# Patient Record
Sex: Female | Born: 1960 | Race: White | Hispanic: No | Marital: Married | Smoking: Never smoker
Health system: Southern US, Community
[De-identification: ages and names within clinical notes are randomized; demographics above are authoritative.]

## PROBLEM LIST (undated history)

## (undated) DIAGNOSIS — E039 Hypothyroidism, unspecified: Secondary | ICD-10-CM

## (undated) HISTORY — PX: WISDOM TOOTH EXTRACTION: SHX21

## (undated) HISTORY — PX: CERVICAL POLYPECTOMY: SHX88

---

## 2005-09-25 ENCOUNTER — Encounter (INDEPENDENT_AMBULATORY_CARE_PROVIDER_SITE_OTHER): Payer: Self-pay | Admitting: *Deleted

## 2005-09-25 LAB — CONVERTED CEMR LAB

## 2005-10-01 ENCOUNTER — Encounter: Payer: Self-pay | Admitting: Family Medicine

## 2005-10-01 ENCOUNTER — Ambulatory Visit: Payer: Self-pay | Admitting: Family Medicine

## 2006-06-03 ENCOUNTER — Ambulatory Visit: Payer: Self-pay | Admitting: Family Medicine

## 2006-06-03 ENCOUNTER — Ambulatory Visit (HOSPITAL_COMMUNITY): Admission: RE | Admit: 2006-06-03 | Discharge: 2006-06-03 | Payer: Self-pay | Admitting: Family Medicine

## 2006-06-09 ENCOUNTER — Encounter: Admission: RE | Admit: 2006-06-09 | Discharge: 2006-06-09 | Payer: Self-pay | Admitting: Family Medicine

## 2006-06-11 ENCOUNTER — Ambulatory Visit: Payer: Self-pay | Admitting: Family Medicine

## 2006-06-22 ENCOUNTER — Other Ambulatory Visit: Admission: RE | Admit: 2006-06-22 | Discharge: 2006-06-22 | Payer: Self-pay | Admitting: General Surgery

## 2006-10-26 HISTORY — PX: THYROID LOBECTOMY: SHX420

## 2006-11-22 ENCOUNTER — Encounter: Admission: RE | Admit: 2006-11-22 | Discharge: 2006-11-22 | Payer: Self-pay | Admitting: Endocrinology

## 2006-11-22 ENCOUNTER — Encounter (INDEPENDENT_AMBULATORY_CARE_PROVIDER_SITE_OTHER): Payer: Self-pay | Admitting: Specialist

## 2006-11-22 ENCOUNTER — Other Ambulatory Visit: Admission: RE | Admit: 2006-11-22 | Discharge: 2006-11-22 | Payer: Self-pay | Admitting: Interventional Radiology

## 2006-12-24 ENCOUNTER — Encounter (INDEPENDENT_AMBULATORY_CARE_PROVIDER_SITE_OTHER): Payer: Self-pay | Admitting: *Deleted

## 2007-03-15 ENCOUNTER — Ambulatory Visit (HOSPITAL_COMMUNITY): Admission: RE | Admit: 2007-03-15 | Discharge: 2007-03-16 | Payer: Self-pay | Admitting: General Surgery

## 2007-03-15 ENCOUNTER — Encounter (INDEPENDENT_AMBULATORY_CARE_PROVIDER_SITE_OTHER): Payer: Self-pay | Admitting: General Surgery

## 2007-04-11 ENCOUNTER — Encounter: Payer: Self-pay | Admitting: Family Medicine

## 2007-11-17 ENCOUNTER — Other Ambulatory Visit: Admission: RE | Admit: 2007-11-17 | Discharge: 2007-11-17 | Payer: Self-pay | Admitting: Family Medicine

## 2007-11-29 ENCOUNTER — Encounter: Admission: RE | Admit: 2007-11-29 | Discharge: 2007-11-29 | Payer: Self-pay | Admitting: Endocrinology

## 2008-11-20 ENCOUNTER — Other Ambulatory Visit: Admission: RE | Admit: 2008-11-20 | Discharge: 2008-11-20 | Payer: Self-pay | Admitting: Family Medicine

## 2008-11-27 ENCOUNTER — Encounter: Admission: RE | Admit: 2008-11-27 | Discharge: 2008-11-27 | Payer: Self-pay | Admitting: Family Medicine

## 2008-12-13 ENCOUNTER — Encounter: Admission: RE | Admit: 2008-12-13 | Discharge: 2008-12-13 | Payer: Self-pay | Admitting: Internal Medicine

## 2009-12-09 IMAGING — MG MM SCREEN MAMMOGRAM BILATERAL
4 series · 4 of 4 positions shown · non-contrast
Comparison: none

DG SCREEN MAMMOGRAM BILATERAL
Bilateral CC and MLO view(s) were taken.

DIGITAL SCREENING MAMMOGRAM WITH CAD
The breast tissue is extremely dense.  No masses or malignant type calcifications are identified.

[R CC]
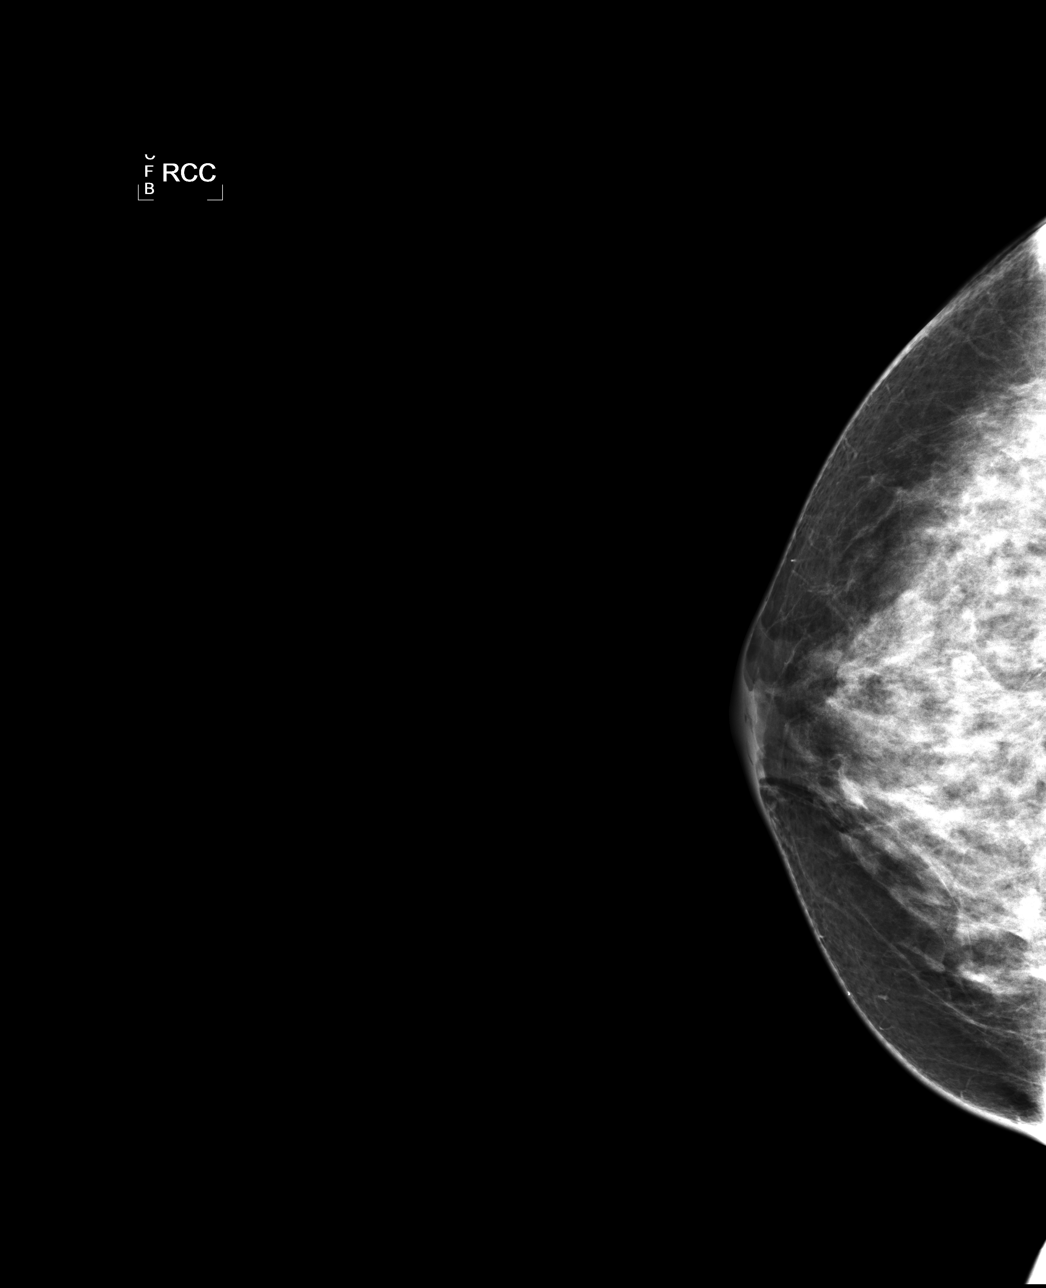

[L CC]
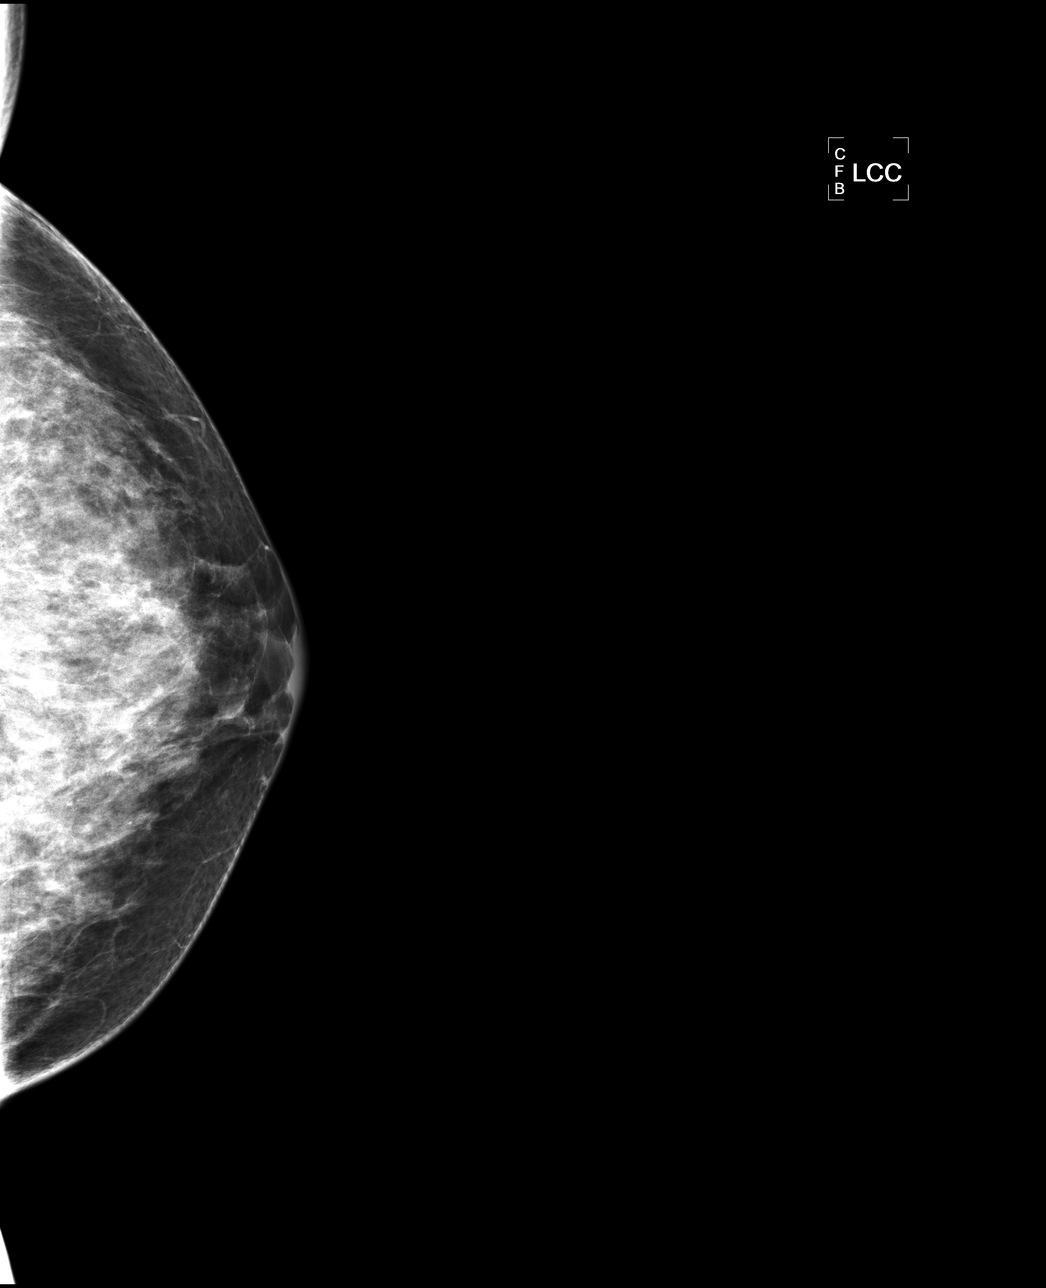

[L MLO]
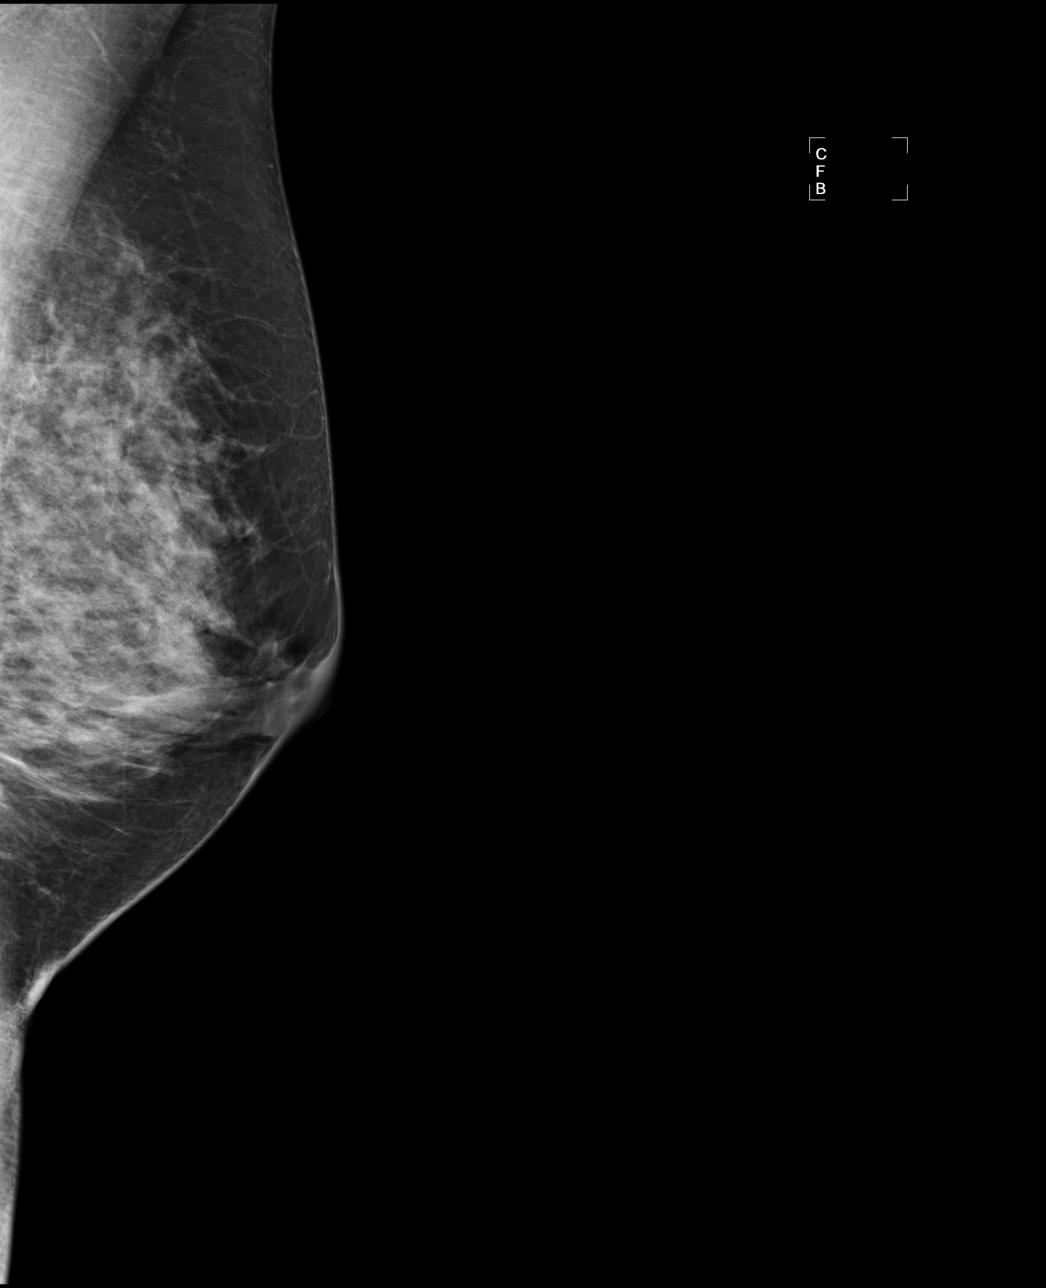

[R MLO]
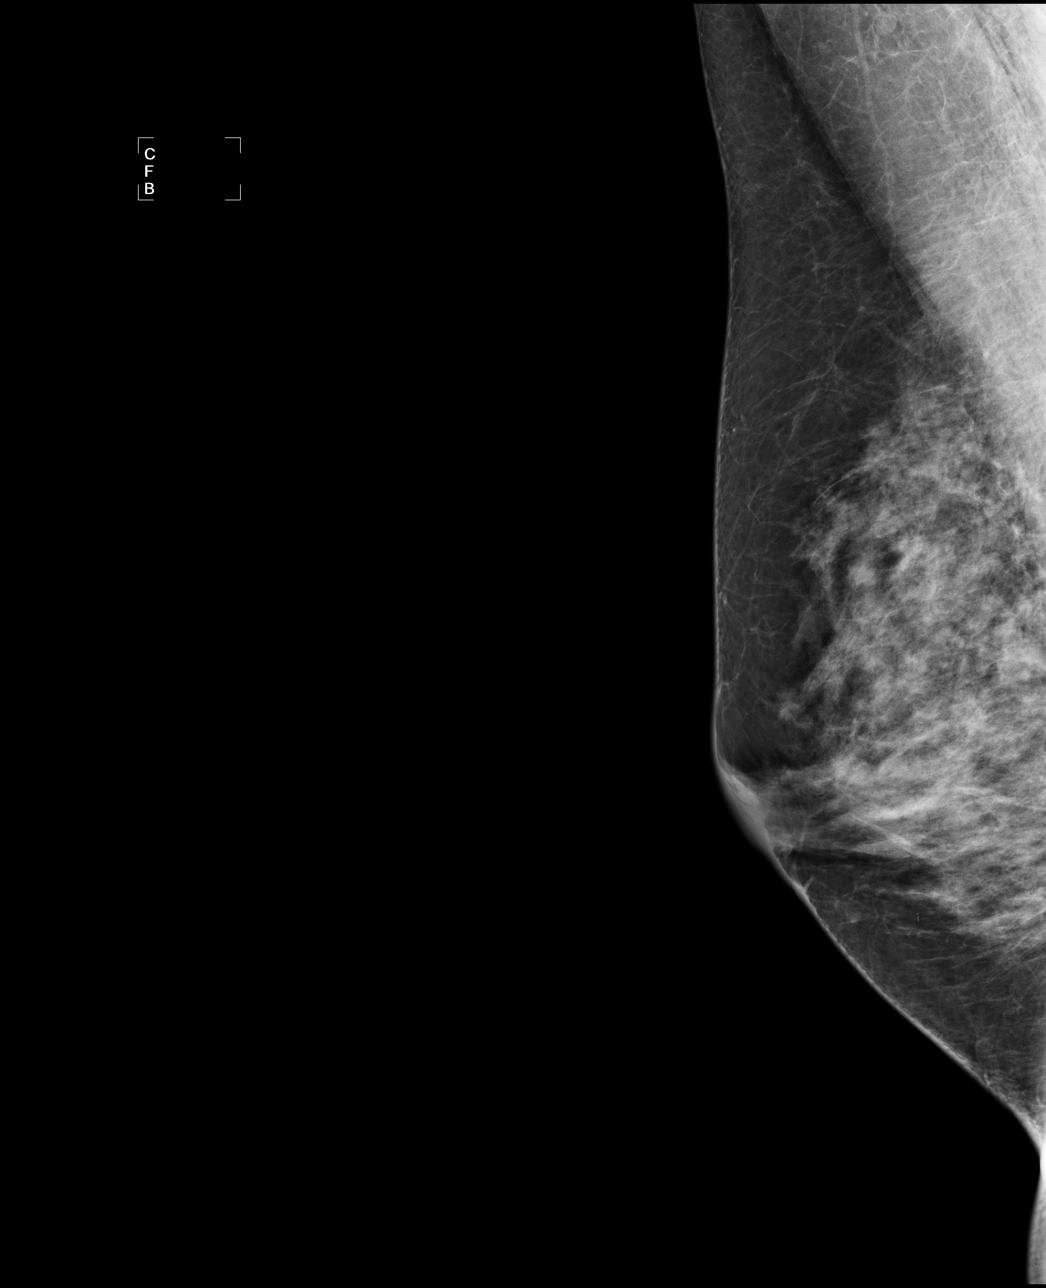

[4 of 4 positions shown; findings below may reference images not displayed]

IMPRESSION: No specific mammographic evidence of malignancy.  Next screening mammogram is recommended in one 
year.

ASSESSMENT: Negative - BI-RADS 1

Screening mammogram in 1 year.
ANALYZED BY COMPUTER AIDED DETECTION. , THIS PROCEDURE WAS A DIGITAL MAMMOGRAM.

## 2010-04-25 ENCOUNTER — Other Ambulatory Visit: Admission: RE | Admit: 2010-04-25 | Discharge: 2010-04-25 | Payer: Self-pay | Admitting: *Deleted

## 2010-05-09 ENCOUNTER — Encounter: Admission: RE | Admit: 2010-05-09 | Discharge: 2010-05-09 | Payer: Self-pay | Admitting: *Deleted

## 2011-03-10 NOTE — Op Note (Signed)
Helen Parker, Helen Parker              ACCOUNT NO.:  [id number]   MEDICAL RECORD NO.:  [id number]          PATIENT TYPE:  OIB   LOCATION:  2550                         FACILITY:  [facility]   PHYSICIAN:  Cherylynn Ridges, M.D.    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   POSTOPERATIVE DIAGNOSIS:  Benign thyroid mass, likely adenoma.   PROCEDURE:  Left total thyroid lobectomy with isthmusectomy.   SURGEON:  Dr. Lindie Spruce.   ASSISTANT:  Dr. Maple Hudson.   ANESTHESIA:  General endotracheal.   ESTIMATED BLOOD LOSS:  Less than 50 mL.   COMPLICATIONS:  None.   CONDITION:  Stable.   SPECIMEN:  Left thyroid lobe and isthmus.   FINDINGS:  About a 3-cm nodule in the left mid to lower lobe of the  thyroid gland on the left side.   INDICATIONS FOR OPERATION:  The patient is a 50 year old who had  equivocal needle biopsies of a large thyroid mass on the left lobe who  comes in now for thyroidectomy.   OPERATION:  The patient was taken to the operating room, placed on the  table in supine position.  After an adequate endotracheal anesthetic was  administered, a roll was placed underneath the shoulders, her neck was  hyperextended and she was prepped and draped in the usual sterile  manner.  We marked the neck for incision using an old silk suture.  We  used this line to make a 5-cm transverse incision.  The mid point was  marked.  We took it down to and through the platysmal muscle layer and  then we split it between the strap muscles.  Flaps were made superiorly  and inferiorly using electrocautery.  Bleeding was controlled with  electrocautery, although some anterior jugular connecting vessels were  ligated with 3-0 silk ties.   Once we got down below the deepest strap muscle layer, we used a Green  retractor to retract the strap muscles  retracting the thyroid gland  medially on the left side.  We initially approached the superior thyroid  vessels which were taken down using right-angle clamps, medium hemoclips  and also 3-0 silk ties.  We were eventually able to bring the upper lobe  downward and medially as we continued to dissect away the areolar tissue  lateral to the thyroid gland.  We stayed very close to the thyroid gland  itself dissecting away what appeared to be a superior parathyroid gland  and also an inferior parathyroid gland.  Staying close to the gland, we  able to ligate most of these vessels using 3-0 silk ties or small  hemoclips.  As we rolled the gland medially, we could see what appeared  to be the recurrent laryngeal nerve coming up from inferior to superior  up to the point where it entered into cricotracheal thyroid membrane.  The ligament attaching the thyroid gland to the trachea was taken down  using electrocautery as we stayed away from  the nerve itself.  We used  blunt and some sharp dissection as we rolled the thyroid medially out to  the isthmus where we came across with the electrocautery.  A piece of  Surgicel was placed in the wound as there was minimal bleeding and no  cautery or ligations were done around the nerve itself.  We irrigated  with some saline solution.  Once this was done, we closed the strap  muscles using interrupted 3-0 Vicryl sutures.  The platysmas muscle was  closed using interrupted 3-0 Vicryl sutures and the skin was closed  using interrupted simple stitches of 5-0 nylon.  Intervening Steri-  Strips were applied, 1/4 inch type. All counts were correct and a  sterile dressing was applied.  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      Cherylynn Ridges, M.D.  Electronically Signed     JOW/MEDQ  D:  03/15/2007  T:  03/15/2007  Job:  [id number]

## 2011-03-30 ENCOUNTER — Other Ambulatory Visit (HOSPITAL_COMMUNITY)
Admission: RE | Admit: 2011-03-30 | Discharge: 2011-03-30 | Disposition: A | Discharge status: Home / Self Care | Payer: BC Managed Care – PPO | Source: Ambulatory Visit | Attending: Internal Medicine | Admitting: Internal Medicine

## 2011-03-31 ENCOUNTER — Other Ambulatory Visit: Payer: Self-pay

## 2011-03-31 DIAGNOSIS — Z01419 Encounter for gynecological examination (general) (routine) without abnormal findings: Secondary | ICD-10-CM | POA: Insufficient documentation

## 2011-04-20 ENCOUNTER — Other Ambulatory Visit: Payer: Self-pay | Admitting: Family Medicine

## 2011-04-20 DIAGNOSIS — Z1231 Encounter for screening mammogram for malignant neoplasm of breast: Secondary | ICD-10-CM

## 2011-05-19 ENCOUNTER — Ambulatory Visit: Payer: Self-pay

## 2011-05-19 ENCOUNTER — Ambulatory Visit
Admission: RE | Admit: 2011-05-19 | Discharge: 2011-05-19 | Disposition: A | Discharge status: Home / Self Care | Payer: BC Managed Care – PPO | Source: Ambulatory Visit | Attending: Family Medicine | Admitting: Family Medicine

## 2011-05-19 DIAGNOSIS — Z1231 Encounter for screening mammogram for malignant neoplasm of breast: Secondary | ICD-10-CM

## 2011-07-13 ENCOUNTER — Other Ambulatory Visit: Payer: Self-pay | Admitting: Obstetrics and Gynecology

## 2011-09-30 ENCOUNTER — Encounter (HOSPITAL_BASED_OUTPATIENT_CLINIC_OR_DEPARTMENT_OTHER): Payer: Self-pay | Admitting: *Deleted

## 2011-09-30 ENCOUNTER — Other Ambulatory Visit: Payer: Self-pay | Admitting: Orthopedic Surgery

## 2011-09-30 NOTE — Progress Notes (Signed)
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

## 2011-10-01 ENCOUNTER — Encounter (HOSPITAL_BASED_OUTPATIENT_CLINIC_OR_DEPARTMENT_OTHER)
Admission: RE | Disposition: A | Discharge status: Home / Self Care | Payer: Self-pay | Source: Ambulatory Visit | Attending: Orthopedic Surgery

## 2011-10-01 ENCOUNTER — Encounter (HOSPITAL_BASED_OUTPATIENT_CLINIC_OR_DEPARTMENT_OTHER): Payer: Self-pay | Admitting: Anesthesiology

## 2011-10-01 ENCOUNTER — Ambulatory Visit (HOSPITAL_BASED_OUTPATIENT_CLINIC_OR_DEPARTMENT_OTHER): Payer: BC Managed Care – PPO | Admitting: Anesthesiology

## 2011-10-01 ENCOUNTER — Encounter (HOSPITAL_BASED_OUTPATIENT_CLINIC_OR_DEPARTMENT_OTHER): Payer: Self-pay

## 2011-10-01 ENCOUNTER — Ambulatory Visit (HOSPITAL_BASED_OUTPATIENT_CLINIC_OR_DEPARTMENT_OTHER)
Admission: RE | Admit: 2011-10-01 | Discharge: 2011-10-01 | Disposition: A | Discharge status: Home / Self Care | Payer: BC Managed Care – PPO | Source: Ambulatory Visit | Attending: Orthopedic Surgery | Admitting: Orthopedic Surgery

## 2011-10-01 ENCOUNTER — Encounter (HOSPITAL_BASED_OUTPATIENT_CLINIC_OR_DEPARTMENT_OTHER): Payer: Self-pay | Admitting: Orthopedic Surgery

## 2011-10-01 DIAGNOSIS — Y92009 Unspecified place in unspecified non-institutional (private) residence as the place of occurrence of the external cause: Secondary | ICD-10-CM | POA: Insufficient documentation

## 2011-10-01 DIAGNOSIS — IMO0002 Reserved for concepts with insufficient information to code with codable children: Secondary | ICD-10-CM | POA: Insufficient documentation

## 2011-10-01 DIAGNOSIS — X58XXXA Exposure to other specified factors, initial encounter: Secondary | ICD-10-CM | POA: Insufficient documentation

## 2011-10-01 HISTORY — PX: ORIF FINGER FRACTURE: SHX2122

## 2011-10-01 HISTORY — DX: Hypothyroidism, unspecified: E03.9

## 2011-10-01 SURGERY — OPEN REDUCTION INTERNAL FIXATION (ORIF) METACARPAL (FINGER) FRACTURE
Anesthesia: General | Site: Hand | Laterality: Left | Wound class: Clean

## 2011-10-01 MED ORDER — HYDROMORPHONE HCL PF 1 MG/ML IJ SOLN: 0.2500 mg | INTRAMUSCULAR | Status: DC | PRN

## 2011-10-01 MED ORDER — LACTATED RINGERS IV SOLN
INTRAVENOUS | Status: DC
  Administered 2011-10-01 (×3): via INTRAVENOUS

## 2011-10-01 MED ORDER — PROMETHAZINE HCL 25 MG/ML IJ SOLN: 6.2500 mg | INTRAMUSCULAR | Status: DC | PRN

## 2011-10-01 MED ORDER — HYDROCODONE-ACETAMINOPHEN 5-325 MG PO TABS: ORAL_TABLET | ORAL | Status: AC

## 2011-10-01 MED ORDER — LIDOCAINE HCL (PF) 2 % IJ SOLN
INTRAMUSCULAR | Status: DC | PRN
  Administered 2011-10-01: 4 mL

## 2011-10-01 MED ORDER — CEFAZOLIN SODIUM 1-5 GM-% IV SOLN
1.0000 g | Freq: Once | INTRAVENOUS | Status: AC
  Administered 2011-10-01: 1 g via INTRAVENOUS

## 2011-10-01 MED ORDER — CHLORHEXIDINE GLUCONATE 4 % EX LIQD: 60.0000 mL | Freq: Once | CUTANEOUS | Status: DC

## 2011-10-01 MED ORDER — MEPERIDINE HCL 25 MG/ML IJ SOLN: 6.2500 mg | INTRAMUSCULAR | Status: DC | PRN

## 2011-10-01 MED ORDER — MIDAZOLAM HCL 5 MG/5ML IJ SOLN
INTRAMUSCULAR | Status: DC | PRN
  Administered 2011-10-01: 2 mg via INTRAVENOUS

## 2011-10-01 MED ORDER — CEPHALEXIN 500 MG PO CAPS: 500.0000 mg | ORAL_CAPSULE | Freq: Three times a day (TID) | ORAL | Status: AC

## 2011-10-01 MED ORDER — PROPOFOL 10 MG/ML IV EMUL
INTRAVENOUS | Status: DC | PRN
  Administered 2011-10-01: 200 mg via INTRAVENOUS

## 2011-10-01 MED ORDER — DEXAMETHASONE SODIUM PHOSPHATE 10 MG/ML IJ SOLN
INTRAMUSCULAR | Status: DC | PRN
  Administered 2011-10-01: 10 mg via INTRAVENOUS

## 2011-10-01 MED ORDER — FENTANYL CITRATE 0.05 MG/ML IJ SOLN
INTRAMUSCULAR | Status: DC | PRN
  Administered 2011-10-01 (×2): 25 ug via INTRAVENOUS
  Administered 2011-10-01: 100 ug via INTRAVENOUS

## 2011-10-01 MED ORDER — ONDANSETRON HCL 4 MG/2ML IJ SOLN
INTRAMUSCULAR | Status: DC | PRN
  Administered 2011-10-01: 4 mg via INTRAVENOUS

## 2011-10-01 SURGICAL SUPPLY — 67 items
BANDAGE ADHESIVE 1X3 (GAUZE/BANDAGES/DRESSINGS) IMPLANT
BANDAGE ELASTIC 3 VELCRO ST LF (GAUZE/BANDAGES/DRESSINGS) ×2 IMPLANT
BANDAGE GAUZE ELAST BULKY 4 IN (GAUZE/BANDAGES/DRESSINGS) IMPLANT
BIT DRILL 1.0 (BIT) ×2
BIT DRILL 1.0X50 (BIT) IMPLANT
BLADE MINI RND TIP GREEN BEAV (BLADE) ×1 IMPLANT
BLADE SURG 15 STRL LF DISP TIS (BLADE) ×1 IMPLANT
BLADE SURG 15 STRL SS (BLADE) ×2
BNDG CMPR 9X4 STRL LF SNTH (GAUZE/BANDAGES/DRESSINGS) ×1
BNDG CMPR MD 5X2 ELC HKLP STRL (GAUZE/BANDAGES/DRESSINGS) ×1
BNDG COHESIVE 1X5 TAN STRL LF (GAUZE/BANDAGES/DRESSINGS) IMPLANT
BNDG ELASTIC 2 VLCR STRL LF (GAUZE/BANDAGES/DRESSINGS) ×1 IMPLANT
BNDG ESMARK 4X9 LF (GAUZE/BANDAGES/DRESSINGS) ×1 IMPLANT
BRUSH SCRUB EZ PLAIN DRY (MISCELLANEOUS) ×2 IMPLANT
CANISTER SUCTION 1200CC (MISCELLANEOUS) ×1 IMPLANT
CLOTH BEACON ORANGE TIMEOUT ST (SAFETY) ×1 IMPLANT
CORDS BIPOLAR (ELECTRODE) ×2 IMPLANT
COVER MAYO STAND STRL (DRAPES) ×2 IMPLANT
COVER TABLE BACK 60X90 (DRAPES) ×2 IMPLANT
CUFF TOURNIQUET SINGLE 18IN (TOURNIQUET CUFF) ×1 IMPLANT
DECANTER SPIKE VIAL GLASS SM (MISCELLANEOUS) ×1 IMPLANT
DRAPE OEC MINIVIEW 54X84 (DRAPES) ×3 IMPLANT
DRAPE SURG 17X23 STRL (DRAPES) ×2 IMPLANT
GLOVE BIO SURGEON STRL SZ 6.5 (GLOVE) ×1 IMPLANT
GLOVE BIOGEL M STRL SZ7.5 (GLOVE) ×2 IMPLANT
GLOVE EXAM NITRILE EXT CUFF MD (GLOVE) ×1 IMPLANT
GLOVE ORTHO TXT STRL SZ7.5 (GLOVE) ×2 IMPLANT
GOWN PREVENTION PLUS XLARGE (GOWN DISPOSABLE) ×2 IMPLANT
GOWN STRL REIN XL XLG (GOWN DISPOSABLE) ×4 IMPLANT
NEEDLE 27GAX1X1/2 (NEEDLE) ×1 IMPLANT
NS IRRIG 1000ML POUR BTL (IV SOLUTION) ×2 IMPLANT
PACK BASIN DAY SURGERY FS (CUSTOM PROCEDURE TRAY) ×2 IMPLANT
PAD CAST 3X4 CTTN HI CHSV (CAST SUPPLIES) ×1 IMPLANT
PAD CAST 4YDX4 CTTN HI CHSV (CAST SUPPLIES) IMPLANT
PADDING CAST ABS 4INX4YD NS (CAST SUPPLIES) ×1
PADDING CAST ABS COTTON 4X4 ST (CAST SUPPLIES) ×1 IMPLANT
PADDING CAST COTTON 3X4 STRL (CAST SUPPLIES) ×2
PADDING CAST COTTON 4X4 STRL (CAST SUPPLIES)
PADDING UNDERCAST 2  STERILE (CAST SUPPLIES) ×1 IMPLANT
PLATE Y 1.3 3H HEAD/8H SFT (Plate) IMPLANT
PLATE-Y 1.3 3H HEAD/8H SFT (Plate) ×2 IMPLANT
SCREW SELF TAP CORTEX 1.3 10MM (Screw) ×2 IMPLANT
SCREW SELF TAP CORTEX 1.3 12MM (Screw) ×1 IMPLANT
SCREW SELF TAP CORTEX 1.3 13MM (Screw) ×1 IMPLANT
SCREW SELF TAP CORTEX 1.3 6MM (Screw) ×3 IMPLANT
SCREW SELF TAP CORTEX 1.3 8MM (Screw) ×1 IMPLANT
SLEEVE SCD COMPRESS KNEE MED (MISCELLANEOUS) ×1 IMPLANT
SPLINT PLASTER CAST XFAST 3X15 (CAST SUPPLIES) ×1 IMPLANT
SPLINT PLASTER XTRA FASTSET 3X (CAST SUPPLIES) ×14
SPONGE GAUZE 4X4 12PLY (GAUZE/BANDAGES/DRESSINGS) ×2 IMPLANT
STOCKINETTE 4X48 STRL (DRAPES) ×2 IMPLANT
SUCTION FRAZIER TIP 10 FR DISP (SUCTIONS) ×1 IMPLANT
SUT ETHILON 4 0 PS 2 18 (SUTURE) ×1 IMPLANT
SUT MERSILENE 4 0 P 3 (SUTURE) ×1 IMPLANT
SUT PROLENE 3 0 PS 2 (SUTURE) ×2 IMPLANT
SUT PROLENE 4 0 P 3 18 (SUTURE) ×1 IMPLANT
SUT VIC AB 4-0 P-3 18XBRD (SUTURE) ×1 IMPLANT
SUT VIC AB 4-0 P3 18 (SUTURE) ×2
SYR 3ML 23GX1 SAFETY (SYRINGE) IMPLANT
SYR BULB 3OZ (MISCELLANEOUS) ×2 IMPLANT
SYR CONTROL 10ML LL (SYRINGE) IMPLANT
TAPE STRIPS DRAPE STRL (GAUZE/BANDAGES/DRESSINGS) ×1 IMPLANT
TOWEL OR 17X24 6PK STRL BLUE (TOWEL DISPOSABLE) ×2 IMPLANT
TRAY DSU PREP LF (CUSTOM PROCEDURE TRAY) ×2 IMPLANT
TUBE CONNECTING 20X1/4 (TUBING) ×1 IMPLANT
UNDERPAD 30X30 INCONTINENT (UNDERPADS AND DIAPERS) ×2 IMPLANT
WATER STERILE IRR 1000ML POUR (IV SOLUTION) IMPLANT

## 2011-10-01 NOTE — Brief Op Note (Signed)
10/01/2011  3:37 PM  PATIENT:  Helen Parker  50 y.o. female  PRE-OPERATIVE DIAGNOSIS:  left small proximal phalanx fracture  POST-OPERATIVE DIAGNOSIS:  same  PROCEDURE:  Procedure(s): OPEN REDUCTION INTERNAL FIXATION (ORIF)  LEFT SMALL FINGER COMMINUTED ARTICULAR PILON FRACTURE WITH 1.3 MM AO PLATE  SURGEON:  Surgeon(s): Wyn Forster., MD  PHYSICIAN ASSISTANT:   ASSISTANTS: Annye Rusk, P.A-C  ANESTHESIA:   general  EBL:  Total I/O In: 2000 [I.V.:2000] Out: -   BLOOD ADMINISTERED:none  DRAINS: none   LOCAL MEDICATIONS USED:  LIDOCAINE 4 CC  SPECIMEN:  No Specimen  DISPOSITION OF SPECIMEN:  N/A  COUNTS:  YES  TOURNIQUET:  * Missing tourniquet times found for documented tourniquets in log:  12767 *  DICTATION: .Other Dictation: Dictation Number [id number]  PLAN OF CARE: Discharge to home after PACU  PATIENT DISPOSITION:  PACU - hemodynamically stable.

## 2011-10-01 NOTE — Op Note (Signed)
op note dictated  [phone] 10/01/11

## 2011-10-01 NOTE — H&P (Signed)
Helen Parker is an 50 y.o. female.   Chief Complaint: Complaining of fracture to the left small finger. HPI: Patient is a 50 year old right-hand-dominant female who sustained an injury to her right small finger while attempting to catch a football. She was visiting [facility] during the time of the injury. She went to one of the local urgent cares. X-rays were taken this revealed a comminuted fracture of the P-one segment of the left small finger. She was placed in a splint and told to followup with an orthopedic surgeon when she returned to [facility]. She presented to our office on Wednesday, 09/30/2011 for further evaluation and treatment. We reviewed her x-rays and examined the finger. She was placed in a new splint and instructed that she would be best served by undergoing open reduction internal fixation of the fracture.  Past Medical History  Diagnosis Date  . Hypothyroidism     Past Surgical History  Procedure Date  . Wisdom tooth extraction   . Thyroid lobectomy 2008    lt     History reviewed. No pertinent family history. Social History:  reports that she has never smoked. She does not have any smokeless tobacco history on file. She reports that she drinks alcohol. She reports that she does not use illicit drugs.  Allergies: No Known Allergies  Medications Prior to Admission  Medication Dose Route Frequency Provider Last Rate Last Dose  . ceFAZolin (ANCEF) IVPB 1 g/50 mL premix  1 g Intravenous Once       . chlorhexidine (HIBICLENS) 4 % liquid 4 application  60 mL Topical Once       . lactated ringers infusion   Intravenous Continuous Josepha Pigg, MD 20 mL/hr at 10/01/11 1256     Medications Prior to Admission  Medication Sig Dispense Refill  . acetaminophen (TYLENOL) 325 MG tablet Take 650 mg by mouth every 6 (six) hours as needed.        . calcium-vitamin D (OSCAL WITH D) 500-200 MG-UNIT per tablet Take 1 tablet by mouth daily.        . ferrous fumarate  (HEMOCYTE - 106 MG FE) 325 (106 FE) MG TABS Take 1 tablet by mouth.        . levothyroxine (SYNTHROID, LEVOTHROID) 50 MCG tablet Take 50 mcg by mouth daily.          No results found for this or any previous visit (from the past 48 hour(s)).  No results found.   Pertinent items are noted in HPI.  Blood pressure 144/71, pulse 72, temperature 98 F (36.7 C), temperature source Oral, resp. rate 20, height 5\' 8"  (1.727 m), weight 70.308 kg (155 lb), SpO2 100.00%.  General appearance: alert Head: Normocephalic, without obvious abnormality Neck: supple, symmetrical, trachea midline Resp: clear to auscultation bilaterally Cardio: regular rate and rhythm, S1, S2 normal, no murmur, click, rub or gallop GI: normal findings: bowel sounds normal Extremities: Left small finger reveals obvious swelling and deformity with diffuse ecchymosis. She is intact from a neurovascular status distally. X-rays reveal a comminuted fracture of the proximal phalanx. Pulses: 2+ and symmetric Skin: normal Neurologic: Grossly normal    Assessment/Plan Impression: Comminuted fracture of the proximal phalanx of the left small finger.  Plan: Open reduction internal fixation of the left small finger proximal phalanx  DASNOIT,Carlye Panameno J 10/01/2011, 1:37 PM    H&P documentation: 10/01/2011  -History and Physical Reviewed  -Patient has been re-examined  -No change in the plan of care  Molly Maduro  Glenice Bow, MD

## 2011-10-01 NOTE — Transfer of Care (Signed)
Immediate Anesthesia Transfer of Care Note  Patient: Helen Parker  Procedure(s) Performed:  OPEN REDUCTION INTERNAL FIXATION (ORIF) METACARPAL (FINGER) FRACTURE - left small proximal phalanx  Patient Location: PACU  Anesthesia Type: General  Level of Consciousness: sedated  Airway & Oxygen Therapy: Patient Spontanous Breathing and Patient connected to face mask oxygen  Post-op Assessment: Report given to PACU RN and Post -op Vital signs reviewed and stable  Post vital signs: Reviewed and stable  Complications: No apparent anesthesia complications

## 2011-10-01 NOTE — Anesthesia Procedure Notes (Signed)
Procedure Name: LMA Insertion Date/Time: 10/01/2011 2:18 PM Performed by: Gladys Damme Pre-anesthesia Checklist: Patient identified, Timeout performed, Emergency Drugs available, Suction available and Patient being monitored Patient Re-evaluated:Patient Re-evaluated prior to inductionOxygen Delivery Method: Circle System Utilized Preoxygenation: Pre-oxygenation with 100% oxygen Ventilation: Mask ventilation without difficulty LMA: LMA inserted LMA Size: 4.0 Number of attempts: 1 Placement Confirmation: breath sounds checked- equal and bilateral and positive ETCO2 Tube secured with: Tape Dental Injury: Teeth and Oropharynx as per pre-operative assessment

## 2011-10-01 NOTE — Anesthesia Preprocedure Evaluation (Signed)
Anesthesia Evaluation  Patient identified by MRN, date of birth, ID band Patient awake    Reviewed: Allergy & Precautions, H&P , NPO status , Patient's Chart, lab work & pertinent test results  Airway Mallampati: II TM Distance: >3 FB Neck ROM: full    Dental No notable dental hx. (+) Teeth Intact   Pulmonary neg pulmonary ROS,  clear to auscultation  Pulmonary exam normal       Cardiovascular neg cardio ROS regular Normal    Neuro/Psych Negative Neurological ROS  Negative Psych ROS   GI/Hepatic negative GI ROS, Neg liver ROS,   Endo/Other  Negative Endocrine ROS  Renal/GU negative Renal ROS  Genitourinary negative   Musculoskeletal   Abdominal Normal abdominal exam  (+)   Peds  Hematology negative hematology ROS (+)   Anesthesia Other Findings   Reproductive/Obstetrics negative OB ROS                           Anesthesia Physical Anesthesia Plan  ASA: II  Anesthesia Plan: General   Post-op Pain Management:    Induction: Intravenous  Airway Management Planned: LMA  Additional Equipment:   Intra-op Plan:   Post-operative Plan: Extubation in OR  Informed Consent: I have reviewed the patients History and Physical, chart, labs and discussed the procedure including the risks, benefits and alternatives for the proposed anesthesia with the patient or authorized representative who has indicated his/her understanding and acceptance.     Plan Discussed with: CRNA  Anesthesia Plan Comments:         Anesthesia Quick Evaluation

## 2011-10-01 NOTE — Anesthesia Postprocedure Evaluation (Signed)
  Anesthesia Post-op Note  Patient: Helen Parker  Procedure(s) Performed:  OPEN REDUCTION INTERNAL FIXATION (ORIF) METACARPAL (FINGER) FRACTURE - left small proximal phalanx  Patient Location: PACU  Anesthesia Type: General  Level of Consciousness: awake  Airway and Oxygen Therapy: Patient Spontanous Breathing and Patient connected to face mask oxygen  Post-op Pain: mild  Post-op Assessment: Post-op Vital signs reviewed, Patient's Cardiovascular Status Stable, Respiratory Function Stable and Patent Airway  Post-op Vital Signs: Reviewed and stable  Complications: No apparent anesthesia complications

## 2011-10-02 NOTE — Op Note (Signed)
Helen Parker, Helen Parker              ACCOUNT NO.:  [id number]  MEDICAL RECORD NO.:  [id number]  LOCATION:                                 FACILITY:  PHYSICIAN:  Helen Fitch. Ansh Fauble, M.D. DATE OF BIRTH:  08-18-1961  DATE OF PROCEDURE:  10/01/2011 DATE OF DISCHARGE:                              OPERATIVE REPORT   PREOPERATIVE DIAGNOSIS:  Comminuted intra-articular pilon fracture of left small finger proximal phalangeal, proximal metaphysis and epiphysis at MP joint.  POSTOPERATIVE DIAGNOSIS:  Comminuted intra-articular pilon fracture of left small finger proximal phalangeal, proximal metaphysis and epiphysis at MP joint.  OPERATIONS:  Open reduction, internal fixation with an AO 1.3 mm stainless steel bridge plate and lag screw fixation.  SURGEON: Helen Fitch. Helen Grosso, MD.  ASSISTANT:  Helen Rusk, PA-C  ANESTHESIA:  General by LMA.  SUPERVISING ANESTHESIOLOGIST:  Dr. Sampson Parker.  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 While throwing the football with her husband, on September 28, 2011, she accidentally missed caught the ball with a direct blow to the tip of her left small finger.  She had immediate pain, swelling, and ecchymosis. Plain films obtained of her finger demonstrated a comminuted articular  pilon fracture of the left small finger proximal phalangeal base that is shortened more than 4 mm.  She had a claw posture of the finger.  She was splinted and referred for an upper extremity consult.  Past history is reviewed in detail.  She is basically healthy.  She had a history of hypothyroidism and is on replacement thyroid medication.  We advised her that this fracture was very complex.  It needs to be reduced anatomically and secured with interfragmentary lag fixation to control the articular fragments and a bridge plate to regain length.  Our goal is to obtain an anatomic construct that will allow early  active range of motion.  The nature of this fracture is that it is complex and frequently leads to marked finger stiffness and often malunion.  Our first choice is to perform plate fixation followed by early motion.  Questions regarding the anticipated treatment were invited and answered in detail.  PROCEDURE:  Helen Parker is brought to room 1 of the [facility] and placed supine position on the operating table.  Following detailed anesthesia, informed consent by Dr. Sampson Parker general anesthesia by LMA technique was recommended and accepted.  Under Dr. Jarrett Parker direct supervision, general anesthesia by LMA technique was induced followed by routine Betadine scrub and paint of the left upper extremity.  A pneumatic tourniquet was applied proximal brachium, 1 g of Ancef was administered as an IV prophylactic antibiotic.  Procedure commenced the routine surgical time-out.  The arm was exsanguinated with Esmarch bandage and the arterial tourniquet inflated to 220 mmHg.  The proximal phalanx and MP joint capsule were exposed through a longitudinal incision that curved ulnarly around the metacarpal head. The extensor tendon split in the midline revealing the capsule of the MP joint.  The capsule was not taken down nor with the soft tissues at the base of the proximal phalanx as they will be used to hinge the reduction of the fracture.  The fracture site was entered and a dental pick was used to rearrange fragments to allow anatomic reduction of the main shaft fragments.  The articular fracture fragments work then controlled by an extra periosteal dissection with use of a small bone clamp to reduce them anatomically.  A 5 x3 hole construct was created with an AO 1.3-mm plate that was trimmed to the appropriate length followed by placement of carefully measured lag screws in the articular fragments and 4 shaft screws with direct measurement.  C-arm fluoroscopic control  was used to control the reduction of the fracture and the length of the screws.  After some adjustments, an anatomic reduction was achieved.  The wound was then irrigated with sterile saline followed by meticulous repair of the periosteum over the plate with figure-of-eight sutures of 4-0 Vicryl followed by repair of the extensor mechanism with figure-of-eight sutures of 4-0 Mersilene. The skin was repaired with intradermal 3-0 Prolene with Steri-Strips.  Helen Parker was placed on forearm-based ring and small finger safe position splint.  There are no apparent complications.  For aftercare, Helen Parker will be advised to elevate her hand for approximately 48 hours.  She is provided prescriptions for hydrocodone 1 or 2 tablets p.o. q.4-6 h. p.r.n. pain, 30 tablets without refill. Also, Keflex 500 mg 1 p.o. q.8 h.  x3 days.  She is encouraged to use Aleve or Advil for mild-to-moderate pain.     Helen Parker, M.D.     RVS/MEDQ  D:  10/01/2011  T:  10/02/2011  Job:  [id number]  cc:   Helen Fiddler, MD

## 2011-10-05 ENCOUNTER — Encounter (HOSPITAL_BASED_OUTPATIENT_CLINIC_OR_DEPARTMENT_OTHER): Payer: Self-pay | Admitting: Orthopedic Surgery

## 2011-10-05 NOTE — Addendum Note (Signed)
Addendum  created 10/05/11 1104 by Jewel Baize Tommye Lehenbauer   Modules edited:Anesthesia Responsible Staff

## 2011-10-05 NOTE — Addendum Note (Signed)
Addendum  created 10/05/11 0941 by Lance Coon, CRNA   Modules edited:Anesthesia Events

## 2011-10-29 ENCOUNTER — Other Ambulatory Visit: Payer: Self-pay | Admitting: Dermatology

## 2011-11-04 ENCOUNTER — Encounter (HOSPITAL_BASED_OUTPATIENT_CLINIC_OR_DEPARTMENT_OTHER): Payer: Self-pay

## 2012-06-02 ENCOUNTER — Other Ambulatory Visit: Payer: Self-pay | Admitting: Family Medicine

## 2012-06-02 DIAGNOSIS — Z1231 Encounter for screening mammogram for malignant neoplasm of breast: Secondary | ICD-10-CM

## 2012-06-23 ENCOUNTER — Ambulatory Visit
Admission: RE | Admit: 2012-06-23 | Discharge: 2012-06-23 | Disposition: A | Discharge status: Home / Self Care | Payer: BC Managed Care – PPO | Source: Ambulatory Visit | Attending: Family Medicine | Admitting: Family Medicine

## 2012-06-23 DIAGNOSIS — Z1231 Encounter for screening mammogram for malignant neoplasm of breast: Secondary | ICD-10-CM

## 2012-09-07 ENCOUNTER — Other Ambulatory Visit: Payer: Self-pay | Admitting: Obstetrics and Gynecology

## 2012-09-07 ENCOUNTER — Other Ambulatory Visit (HOSPITAL_COMMUNITY)
Admission: RE | Admit: 2012-09-07 | Discharge: 2012-09-07 | Disposition: A | Discharge status: Home / Self Care | Payer: BC Managed Care – PPO | Source: Ambulatory Visit | Attending: Obstetrics and Gynecology | Admitting: Obstetrics and Gynecology

## 2012-09-07 DIAGNOSIS — Z01419 Encounter for gynecological examination (general) (routine) without abnormal findings: Secondary | ICD-10-CM | POA: Insufficient documentation

## 2013-03-29 ENCOUNTER — Telehealth: Payer: Self-pay

## 2013-03-29 NOTE — Telephone Encounter (Signed)
LMOM to call.

## 2013-04-12 NOTE — Telephone Encounter (Signed)
LMOM to call.

## 2013-04-18 NOTE — Telephone Encounter (Signed)
Letter to pt and PCP.  

## 2013-09-19 ENCOUNTER — Other Ambulatory Visit: Payer: Self-pay | Admitting: Obstetrics and Gynecology

## 2013-09-19 ENCOUNTER — Other Ambulatory Visit (HOSPITAL_COMMUNITY)
Admission: RE | Admit: 2013-09-19 | Discharge: 2013-09-19 | Disposition: A | Discharge status: Home / Self Care | Payer: BC Managed Care – PPO | Source: Ambulatory Visit | Attending: Obstetrics and Gynecology | Admitting: Obstetrics and Gynecology

## 2013-09-19 DIAGNOSIS — Z01419 Encounter for gynecological examination (general) (routine) without abnormal findings: Secondary | ICD-10-CM | POA: Insufficient documentation

## 2013-09-19 DIAGNOSIS — Z1151 Encounter for screening for human papillomavirus (HPV): Secondary | ICD-10-CM | POA: Insufficient documentation

## 2014-04-23 ENCOUNTER — Other Ambulatory Visit: Payer: Self-pay

## 2014-04-23 DIAGNOSIS — Z1231 Encounter for screening mammogram for malignant neoplasm of breast: Secondary | ICD-10-CM

## 2014-05-10 ENCOUNTER — Ambulatory Visit
Admission: RE | Admit: 2014-05-10 | Discharge: 2014-05-10 | Disposition: A | Discharge status: Home / Self Care | Payer: BC Managed Care – PPO | Source: Ambulatory Visit

## 2014-05-10 ENCOUNTER — Encounter (INDEPENDENT_AMBULATORY_CARE_PROVIDER_SITE_OTHER): Payer: Self-pay

## 2014-05-10 DIAGNOSIS — Z1231 Encounter for screening mammogram for malignant neoplasm of breast: Secondary | ICD-10-CM

## 2014-09-03 ENCOUNTER — Encounter: Payer: Self-pay | Admitting: Gastroenterology

## 2014-09-24 ENCOUNTER — Other Ambulatory Visit: Payer: Self-pay | Admitting: Obstetrics and Gynecology

## 2014-09-24 ENCOUNTER — Other Ambulatory Visit (HOSPITAL_COMMUNITY)
Admission: RE | Admit: 2014-09-24 | Discharge: 2014-09-24 | Disposition: A | Discharge status: Home / Self Care | Payer: BC Managed Care – PPO | Source: Ambulatory Visit | Attending: Obstetrics and Gynecology | Admitting: Obstetrics and Gynecology

## 2014-09-24 DIAGNOSIS — Z01419 Encounter for gynecological examination (general) (routine) without abnormal findings: Secondary | ICD-10-CM | POA: Insufficient documentation

## 2014-09-25 LAB — CYTOLOGY - PAP

## 2014-11-19 ENCOUNTER — Encounter: Payer: BC Managed Care – PPO | Admitting: Gastroenterology

## 2015-02-15 ENCOUNTER — Other Ambulatory Visit: Payer: Self-pay

## 2015-02-15 DIAGNOSIS — Z1231 Encounter for screening mammogram for malignant neoplasm of breast: Secondary | ICD-10-CM

## 2015-05-14 ENCOUNTER — Ambulatory Visit: Payer: Self-pay

## 2015-05-20 ENCOUNTER — Ambulatory Visit
Admission: RE | Admit: 2015-05-20 | Discharge: 2015-05-20 | Disposition: A | Discharge status: Home / Self Care | Payer: BLUE CROSS/BLUE SHIELD | Source: Ambulatory Visit

## 2015-05-20 DIAGNOSIS — Z1231 Encounter for screening mammogram for malignant neoplasm of breast: Secondary | ICD-10-CM

## 2015-09-23 ENCOUNTER — Other Ambulatory Visit: Payer: Self-pay | Admitting: Obstetrics and Gynecology

## 2015-09-23 ENCOUNTER — Other Ambulatory Visit (HOSPITAL_COMMUNITY)
Admission: RE | Admit: 2015-09-23 | Discharge: 2015-09-23 | Disposition: A | Discharge status: Home / Self Care | Payer: BLUE CROSS/BLUE SHIELD | Source: Ambulatory Visit | Attending: Obstetrics and Gynecology | Admitting: Obstetrics and Gynecology

## 2015-09-23 DIAGNOSIS — Z01419 Encounter for gynecological examination (general) (routine) without abnormal findings: Secondary | ICD-10-CM | POA: Diagnosis not present

## 2015-09-24 LAB — CYTOLOGY - PAP

## 2016-03-02 ENCOUNTER — Telehealth: Payer: Self-pay

## 2016-03-02 NOTE — Telephone Encounter (Signed)
Per Helen Parker, pt would like to have a screening colonoscopy. I called pt and she will call me back a little later today.

## 2016-03-03 NOTE — Telephone Encounter (Signed)
Triaged pt and scheduled her for 03/30/2016 at 9:30 Am with Dr. Darrick PennaFields.

## 2016-03-09 NOTE — Telephone Encounter (Signed)
Gastroenterology Pre-Procedure Review  Request Date: 03/02/2016 Requesting Physician: PT REQUESTING HER FIRST COLONOSCOPY ( THIS IS LESLIE'S S[employer]IN[employer])  PATIENT REVIEW QUESTIONS: The patient responded to the following health history questions as indicated:    1. Diabetes Melitis: no 2. Joint replacements in the past 12 months: no 3. Major health problems in the past 3 months: no 4. Has an artificial valve or MVP: no 5. Has a defibrillator: no 6. Has been advised in past to take antibiotics in advance of a procedure like teeth cleaning: no 7. Family history of colon cancer: no  8. Alcohol Use: OCCASIONALLY 9. History of sleep apnea: no     MEDICATIONS & ALLERGIES:    Patient reports the following regarding taking any blood thinners:   Plavix? no Aspirin? no Coumadin? no  Patient confirms/reports the following medications:  Current Outpatient Prescriptions  Medication Sig Dispense Refill  . acetaminophen (TYLENOL) 325 MG tablet Take 650 mg by mouth every 6 (six) hours as needed.      . calcium-vitamin D (OSCAL WITH D) 500-200 MG-UNIT per tablet Take 1 tablet by mouth daily.      . ferrous fumarate (HEMOCYTE - 106 MG FE) 325 (106 FE) MG TABS Take 1 tablet by mouth.      . levothyroxine (SYNTHROID, LEVOTHROID) 50 MCG tablet Take 50 mcg by mouth daily.       No current facility-administered medications for this visit.    Patient confirms/reports the following allergies:  No Known Allergies  No orders of the defined types were placed in this encounter.    AUTHORIZATION INFORMATION Primary Insurance:   ID #:   Group #:  Pre-Cert / Auth required: Pre-Cert / Auth #:   Secondary Insurance:   ID #:   Group #:  Pre-Cert / Auth required:  Pre-Cert / Auth #:   SCHEDULE INFORMATION: Procedure has been scheduled as follows:  Date:  03/30/2016              Time: 9:30 AM  Location: [facility] Short Stay  This Gastroenterology Pre-Precedure Review Form is being routed to  the following provider(s): Jonette EvaSandi Fields, MD

## 2016-03-10 NOTE — Telephone Encounter (Signed)
PREPOPIK-DRINK WATER TO KEEP URINE LIGHT YELLOW.  FULL LIQUIDS WITH BREAKFAST.  Full Liquid Diet A high-calorie, high-protein supplement should be used to meet your nutritional requirements when the full liquid diet is continued for more than 2 or 3 days. If this diet is to be used for an extended period of time (more than 7 days), a multivitamin should be considered.  Breads and Starches  Allowed: None are allowed   Avoid: Any others.    Potatoes/Pasta/Rice  Allowed: ANY ITEM AS A SOUP OR SMALL PLATE OF MASHED POTATOES OR SCRAMBLED EGGS.       Vegetables  Allowed: Strained tomato or vegetable juice. Vegetables pureed in soup.   Avoid: Any others.    Fruit  Allowed: Any strained fruit juices and fruit drinks. Include 1 serving of citrus or vitamin C-enriched fruit juice daily.   Avoid: Any others.  Meat and Meat Substitutes  Allowed: Egg  Avoid: Any meat, fish, or fowl. All cheese.  Milk  Allowed: SOY Milk beverages, including milk shakes and instant breakfast mixes. Smooth yogurt.   Avoid: Any others. Avoid dairy products if not tolerated.    Soups and Combination Foods  Allowed: Broth, strained cream soups. Strained, broth-based soups.   Avoid: Any others.    Desserts and Sweets  Allowed: flavored gelatin, tapioca, ice cream, sherbet, smooth pudding, junket, fruit ices, frozen ice pops, pudding pops, frozen fudge pops, chocolate syrup. Sugar, honey, jelly, syrup.   Avoid: Any others.  Fats and Oils  Allowed: Margarine, butter, cream, sour cream, oils.   Avoid: Any others.  Beverages  Allowed: All.   Avoid: None.  Condiments  Allowed: Iodized salt, pepper, spices, flavorings. Cocoa powder.   Avoid: Any others.    SAMPLE MEAL PLAN Breakfast   cup orange juice.   1 OR 2 EGGS  1 cup milk.   1 cup beverage (coffee or tea).   Cream or sugar, if desired.    Midmorning Snack  2 SCRAMBLED OR HARD BOILED EGG   Lunch  1 cup cream  soup.    cup fruit juice.   1 cup milk.    cup custard.   1 cup beverage (coffee or tea).   Cream or sugar, if desired.    Midafternoon Snack  1 cup milk shake.  Dinner  1 cup cream soup.    cup fruit juice.   1 cup MILK    cup pudding.   1 cup beverage (coffee or tea).   Cream or sugar, if desired.  Evening Snack  1 cup supplement.  To increase calories, add sugar, cream, butter, or margarine if possible. Nutritional supplements will also increase the total calories. 

## 2016-03-17 NOTE — Telephone Encounter (Signed)
Pt has rescheduled to 04/06/2016 at 12:45 PM.

## 2016-03-18 ENCOUNTER — Other Ambulatory Visit: Payer: Self-pay

## 2016-03-18 DIAGNOSIS — Z1211 Encounter for screening for malignant neoplasm of colon: Secondary | ICD-10-CM

## 2016-03-18 MED ORDER — SOD PICOSULFATE-MAG OX-CIT ACD 10-3.5-12 MG-GM-GM PO PACK: 1.0000 | PACK | ORAL | Status: DC

## 2016-03-18 NOTE — Telephone Encounter (Signed)
Rx sent to the pharmacy and instructions mailed to pt.  

## 2016-03-18 NOTE — Telephone Encounter (Signed)
Pt is aware the date is 04/10/2016 NOT 04/06/2016 and I wrote it on her instructions.

## 2016-03-27 ENCOUNTER — Telehealth: Payer: Self-pay

## 2016-03-27 NOTE — Telephone Encounter (Signed)
I called [employer] @ [phone] and spoke with Cherita S who said that a PA is not required for screening colonoscopy.

## 2016-04-07 ENCOUNTER — Telehealth: Payer: Self-pay

## 2016-04-07 MED ORDER — PEG 3350-KCL-NA BICARB-NACL 420 G PO SOLR: 4000.0000 mL | ORAL | Status: DC

## 2016-04-07 NOTE — Telephone Encounter (Signed)
Pt called and said that prep was too expensive. Sending in Bass Lakerilyte and faxing instructions to the pharmacy.

## 2016-04-10 ENCOUNTER — Encounter (HOSPITAL_COMMUNITY)
Admission: RE | Disposition: A | Discharge status: Home / Self Care | Payer: Self-pay | Source: Ambulatory Visit | Attending: Gastroenterology

## 2016-04-10 ENCOUNTER — Ambulatory Visit (HOSPITAL_COMMUNITY)
Admission: RE | Admit: 2016-04-10 | Discharge: 2016-04-10 | Disposition: A | Discharge status: Home / Self Care | Payer: BLUE CROSS/BLUE SHIELD | Source: Ambulatory Visit | Attending: Gastroenterology | Admitting: Gastroenterology

## 2016-04-10 ENCOUNTER — Encounter (HOSPITAL_COMMUNITY): Payer: Self-pay | Admitting: *Deleted

## 2016-04-10 DIAGNOSIS — K648 Other hemorrhoids: Secondary | ICD-10-CM | POA: Diagnosis not present

## 2016-04-10 DIAGNOSIS — Z1211 Encounter for screening for malignant neoplasm of colon: Secondary | ICD-10-CM | POA: Diagnosis present

## 2016-04-10 DIAGNOSIS — Q438 Other specified congenital malformations of intestine: Secondary | ICD-10-CM | POA: Insufficient documentation

## 2016-04-10 DIAGNOSIS — K5730 Diverticulosis of large intestine without perforation or abscess without bleeding: Secondary | ICD-10-CM | POA: Diagnosis not present

## 2016-04-10 DIAGNOSIS — E039 Hypothyroidism, unspecified: Secondary | ICD-10-CM | POA: Diagnosis not present

## 2016-04-10 DIAGNOSIS — Z79899 Other long term (current) drug therapy: Secondary | ICD-10-CM | POA: Insufficient documentation

## 2016-04-10 HISTORY — PX: COLONOSCOPY: SHX5424

## 2016-04-10 SURGERY — COLONOSCOPY
Anesthesia: Moderate Sedation

## 2016-04-10 MED ORDER — SIMETHICONE 40 MG/0.6ML PO SUSP
ORAL | Status: DC | PRN
  Administered 2016-04-10: 13:00:00

## 2016-04-10 MED ORDER — MEPERIDINE HCL 100 MG/ML IJ SOLN
INTRAMUSCULAR | Status: AC
  Filled 2016-04-10: qty 2

## 2016-04-10 MED ORDER — MIDAZOLAM HCL 5 MG/5ML IJ SOLN
INTRAMUSCULAR | Status: AC
  Filled 2016-04-10: qty 10

## 2016-04-10 MED ORDER — SODIUM CHLORIDE 0.9 % IV SOLN
INTRAVENOUS | Status: DC
Start: 2016-04-10 — End: 2016-04-10
  Administered 2016-04-10: 12:00:00 via INTRAVENOUS

## 2016-04-10 MED ORDER — MIDAZOLAM HCL 5 MG/5ML IJ SOLN
INTRAMUSCULAR | Status: DC | PRN
Start: 2016-04-10 — End: 2016-04-10
  Administered 2016-04-10: 2 mg via INTRAVENOUS
  Administered 2016-04-10 (×2): 1 mg via INTRAVENOUS
  Administered 2016-04-10 (×2): 2 mg via INTRAVENOUS

## 2016-04-10 MED ORDER — MEPERIDINE HCL 100 MG/ML IJ SOLN
INTRAMUSCULAR | Status: DC | PRN
  Administered 2016-04-10 (×2): 25 mg via INTRAVENOUS
  Administered 2016-04-10: 50 mg via INTRAVENOUS

## 2016-04-10 NOTE — Discharge Instructions (Signed)
You have small internal hemorrhoids and diverticulosis in your LEFT COLON. YOU HAVE A FLOPPY LEFT COLON.   DRINK WATER TO KEEP YOUR URINE LIGHT YELLOW.  FOLLOW A HIGH FIBER DIET. AVOID ITEMS THAT CAUSE BLOATING & GAS. SEE INFO BELOW.  Next colonoscopy in 10 years.   Colonoscopy Care After Read the instructions outlined below and refer to this sheet in the next week. These discharge instructions provide you with general information on caring for yourself after you leave the hospital. While your treatment has been planned according to the most current medical practices available, unavoidable complications occasionally occur. If you have any problems or questions after discharge, call DR. Hank Walling, [phone].  ACTIVITY  You may resume your regular activity, but move at a slower pace for the next 24 hours.   Take frequent rest periods for the next 24 hours.   Walking will help get rid of the air and reduce the bloated feeling in your belly (abdomen).   No driving for 24 hours (because of the medicine (anesthesia) used during the test).   You may shower.   Do not sign any important legal documents or operate any machinery for 24 hours (because of the anesthesia used during the test).    NUTRITION  Drink plenty of fluids.   You may resume your normal diet as instructed by your doctor.   Begin with a light meal and progress to your normal diet. Heavy or fried foods are harder to digest and may make you feel sick to your stomach (nauseated).   Avoid alcoholic beverages for 24 hours or as instructed.    MEDICATIONS  You may resume your normal medications.   WHAT YOU CAN EXPECT TODAY  Some feelings of bloating in the abdomen.   Passage of more gas than usual.   Spotting of blood in your stool or on the toilet paper  .  IF YOU HAD POLYPS REMOVED DURING THE COLONOSCOPY:  Eat a soft diet IF YOU HAVE NAUSEA, BLOATING, ABDOMINAL PAIN, OR VOMITING.    FINDING OUT THE  RESULTS OF YOUR TEST Not all test results are available during your visit. DR. Darrick Penna WILL CALL YOU WITHIN 7 DAYS OF YOUR PROCEDUE WITH YOUR RESULTS. Do not assume everything is normal if you have not heard from DR. Magdalena Skilton IN ONE WEEK, CALL HER OFFICE AT [phone].  SEEK IMMEDIATE MEDICAL ATTENTION AND CALL THE OFFICE: [phone] IF:  You have more than a spotting of blood in your stool.   Your belly is swollen (abdominal distention).   You are nauseated or vomiting.   You have a temperature over 101F.   You have abdominal pain or discomfort that is severe or gets worse throughout the day.  High-Fiber Diet A high-fiber diet changes your normal diet to include more whole grains, legumes, fruits, and vegetables. Changes in the diet involve replacing refined carbohydrates with unrefined foods. The calorie level of the diet is essentially unchanged. The Dietary Reference Intake (recommended amount) for adult males is 38 grams per day. For adult females, it is 25 grams per day. Pregnant and lactating women should consume 28 grams of fiber per day. Fiber is the intact part of a plant that is not broken down during digestion. Functional fiber is fiber that has been isolated from the plant to provide a beneficial effect in the body. PURPOSE  Increase stool bulk.   Ease and regulate bowel movements.   Lower cholesterol.  REDUCE RISK OF COLON CANCER  INDICATIONS THAT YOU  NEED MORE FIBER  Constipation and hemorrhoids.   Uncomplicated diverticulosis (intestine condition) and irritable bowel syndrome.   Weight management.   As a protective measure against hardening of the arteries (atherosclerosis), diabetes, and cancer.   GUIDELINES FOR INCREASING FIBER IN THE DIET  Start adding fiber to the diet slowly. A gradual increase of about 5 more grams (2 slices of whole-wheat bread, 2 servings of most fruits or vegetables, or 1 bowl of high-fiber cereal) per day is best. Too rapid an  increase in fiber may result in constipation, flatulence, and bloating.   Drink enough water and fluids to keep your urine clear or pale yellow. Water, juice, or caffeine-free drinks are recommended. Not drinking enough fluid may cause constipation.   Eat a variety of high-fiber foods rather than one type of fiber.   Try to increase your intake of fiber through using high-fiber foods rather than fiber pills or supplements that contain small amounts of fiber.   The goal is to change the types of food eaten. Do not supplement your present diet with high-fiber foods, but replace foods in your present diet.   INCLUDE A VARIETY OF FIBER SOURCES  Replace refined and processed grains with whole grains, canned fruits with fresh fruits, and incorporate other fiber sources. White rice, white breads, and most bakery goods contain little or no fiber.   Brown whole-grain rice, buckwheat oats, and many fruits and vegetables are all good sources of fiber. These include: broccoli, Brussels sprouts, cabbage, cauliflower, beets, sweet potatoes, white potatoes (skin on), carrots, tomatoes, eggplant, squash, berries, fresh fruits, and dried fruits.   Cereals appear to be the richest source of fiber. Cereal fiber is found in whole grains and bran. Bran is the fiber-rich outer coat of cereal grain, which is largely removed in refining. In whole-grain cereals, the bran remains. In breakfast cereals, the largest amount of fiber is found in those with "bran" in their names. The fiber content is sometimes indicated on the label.   You may need to include additional fruits and vegetables each day.   In baking, for 1 cup white flour, you may use the following substitutions:   1 cup whole-wheat flour minus 2 tablespoons.   1/2 cup white flour plus 1/2 cup whole-wheat flour.   Diverticulosis Diverticulosis is a common condition that develops when small pouches (diverticula) form in the wall of the colon. The risk of  diverticulosis increases with age. It happens more often in people who eat a low-fiber diet. Most individuals with diverticulosis have no symptoms. Those individuals with symptoms usually experience belly (abdominal) pain, constipation, or loose stools (diarrhea).  HOME CARE INSTRUCTIONS  Increase the amount of fiber in your diet as directed by your caregiver or dietician. This may reduce symptoms of diverticulosis.   Drink at least 6 to 8 glasses of water each day to prevent constipation.   Try not to strain when you have a bowel movement.   Avoiding nuts and seeds to prevent complications is NOT NECESSARY.   FOODS HAVING HIGH FIBER CONTENT INCLUDE:  Fruits. Apple, peach, pear, tangerine, raisins, prunes.   Vegetables. Brussels sprouts, asparagus, broccoli, cabbage, carrot, cauliflower, romaine lettuce, spinach, summer squash, tomato, winter squash, zucchini.   Starchy Vegetables. Baked beans, kidney beans, lima beans, split peas, lentils, potatoes (with skin).   Grains. Whole wheat bread, brown rice, bran flake cereal, plain oatmeal, white rice, shredded wheat, bran muffins.   SEEK IMMEDIATE MEDICAL CARE IF:  You develop increasing pain or  severe bloating.   You have an oral temperature above 101F.   You develop vomiting or bowel movements that are bloody or black.   Hemorrhoids Hemorrhoids are dilated (enlarged) veins around the rectum. Sometimes clots will form in the veins. This makes them swollen and painful. These are called thrombosed hemorrhoids. Causes of hemorrhoids include:  Constipation.   Straining to have a bowel movement.   HEAVY LIFTING  HOME CARE INSTRUCTIONS  Eat a well balanced diet and drink 6 to 8 glasses of water every day to avoid constipation. You may also use a bulk laxative.   Avoid straining to have bowel movements.   Keep anal area dry and clean.   Do not use a donut shaped pillow or sit on the toilet for long periods. This increases blood  pooling and pain.   Move your bowels when your body has the urge; this will require less straining and will decrease pain and pressure.

## 2016-04-10 NOTE — OR Nursing (Signed)
Colowrap applied prior to procedure starting

## 2016-04-10 NOTE — Op Note (Signed)
[facility] Patient Name: Helen Parker Procedure Date: 04/10/2016 12:42 PM MRN: [id number] Date of Birth: [birth year] Attending MD: Jonette Eva , MD CSN: [id number] Age: 55 Admit Type: Outpatient Procedure:                Colonoscopy Indications:              Screening for colorectal malignant neoplasm Providers:                Jonette Eva, MD, Jannett Celestine, RN, Burke Keels,                            Technician Referring MD:             Cain Saupe Medicines:                Meperidine 100 mg IV, Midazolam 8 mg IV Complications:            No immediate complications. Estimated Blood Loss:     Estimated blood loss was minimal. Procedure:                Pre-Anesthesia Assessment:                           - Prior to the procedure, a History and Physical                            was performed, and patient medications and                            allergies were reviewed. The patient's tolerance of                            previous anesthesia was also reviewed. The risks                            and benefits of the procedure and the sedation                            options and risks were discussed with the patient.                            All questions were answered, and informed consent                            was obtained. Prior Anticoagulants: The patient has                            taken no previous anticoagulant or antiplatelet                            agents. ASA Grade Assessment: I - A normal, healthy                            patient. After reviewing the risks and benefits,  the patient was deemed in satisfactory condition to                            undergo the procedure. After obtaining informed                            consent, the colonoscope was passed under direct                            vision. Throughout the procedure, the patient's                            blood pressure, pulse, and oxygen saturations were                             monitored continuously. The EC-[id number] ([id number])                            scope was introduced through the anus and advanced                            to the the cecum, identified by appendiceal orifice                            and ileocecal valve. The ileocecal valve,                            appendiceal orifice, and rectum were photographed.                            The colonoscopy was somewhat difficult due to a                            tortuous colon. Successful completion of the                            procedure was aided by increasing the dose of                            sedation medication, changing the patient to a                            supine position, using manual pressure and                            straightening and shortening the scope to obtain                            bowel loop reduction. The patient tolerated the                            procedure fairly well. The quality of the bowel  preparation was excellent. The ileocecal valve,                            appendiceal orifice, and rectum were photographed. Scope In: 1:03:11 PM Scope Out: 1:23:40 PM Scope Withdrawal Time: 0 hours 11 minutes 8 seconds  Total Procedure Duration: 0 hours 20 minutes 29 seconds  Findings:      The recto-sigmoid colon and sigmoid colon were significantly redundant.       Advancing the scope required COLOWRAP AND SHORT TERM MANUAL PRESSURE.      Many small and large-mouthed diverticula were found in the recto-sigmoid       colon, sigmoid colon and descending colon.      Non-bleeding internal hemorrhoids were found. The hemorrhoids were small. Impression:               - MODERATELY Redundant colon.                           - MODERATE Diverticulosis in the recto-sigmoid                            colon, in the sigmoid colon and in the descending                            colon.                           -  Non-bleeding internal hemorrhoids. Moderate Sedation:      Moderate (conscious) sedation was administered by the endoscopy nurse       and supervised by the endoscopist. The following parameters were       monitored: oxygen saturation, heart rate, blood pressure, and response       to care. Total physician intraservice time was 38 minutes. Recommendation:           - High fiber diet.                           - Continue present medications.                           - Repeat colonoscopy in 10 years for surveillance.                           - Patient has a contact number available for                            emergencies. The signs and symptoms of potential                            delayed complications were discussed with the                            patient. Return to normal activities tomorrow.                            Written discharge instructions were provided to the  patient. Procedure Code(s):        --- Professional ---                           [id number], Colonoscopy, flexible; diagnostic, including                            collection of specimen(s) by brushing or washing,                            when performed (separate procedure)                           99153, Moderate sedation services; each additional                            15 minutes intraservice time                           99153, Moderate sedation services; each additional                            15 minutes intraservice time                           G0500, Moderate sedation services provided by the                            same physician or other qualified health care                            professional performing a gastrointestinal                            endoscopic service that sedation supports,                            requiring the presence of an independent trained                            observer to assist in the monitoring of the                             patient's level of consciousness and physiological                            status; initial 15 minutes of intra-service time;                            patient age 62 years or older (additional time may                            be reported with [id number], as appropriate) Diagnosis Code(s):        --- Professional ---  Z12.11, Encounter for screening for malignant                            neoplasm of colon                           K64.8, Other hemorrhoids                           K57.30, Diverticulosis of large intestine without                            perforation or abscess without bleeding                           Q43.8, Other specified congenital malformations of                            intestine CPT copyright 2016 [employer]. All rights reserved. The codes documented in this report are preliminary and upon coder review may  be revised to meet current compliance requirements. Jonette Eva, MD Jonette Eva, MD 04/10/2016 1:37:32 PM This report has been signed electronically. Number of Addenda: 0

## 2016-04-10 NOTE — H&P (Signed)
  Primary Care Physician:  Cain SaupeFULP, CAMMIE, MD Primary Gastroenterologist:  Dr. Darrick PennaFields  Pre-Procedure History & Physical: HPI:  Helen Parker is a 55 y.o. female here for COLON CANCER SCREENING.  Past Medical History  Diagnosis Date  . Hypothyroidism     Past Surgical History  Procedure Laterality Date  . Wisdom tooth extraction    . Thyroid lobectomy  2008    lt   . Orif finger fracture  10/01/2011    Procedure: OPEN REDUCTION INTERNAL FIXATION (ORIF) METACARPAL (FINGER) FRACTURE;  Surgeon: Wyn Forsterobert V Sypher Jr., MD;  Location: [facility];  Service: Orthopedics;  Laterality: Left;  left small proximal phalanx  . Cervical polypectomy      Prior to Admission medications   Medication Sig Start Date End Date Taking? Authorizing Provider  levothyroxine (SYNTHROID, LEVOTHROID) 50 MCG tablet Take 50 mcg by mouth daily.     Yes Historical Provider, MD  polyethylene glycol-electrolytes (TRILYTE) 420 g solution Take 4,000 mLs by mouth as directed. 04/07/16  Yes West BaliSandi L Ajeenah Heiny, MD  Probiotic Product (CULTURELLE PRO-WELL PO) Take 1 tablet by mouth daily.   Yes Historical Provider, MD  Sod Picosulfate-Mag Ox-Cit Acd 10-3.5-12 MG-GM-GM PACK Take 1 Container by mouth as directed. 03/18/16  Yes West BaliSandi L Rechy Bost, MD  acetaminophen (TYLENOL) 325 MG tablet Take 650 mg by mouth every 6 (six) hours as needed.      Historical Provider, MD    Allergies as of 03/18/2016  . (No Known Allergies)    History reviewed. No pertinent family history.  Social History   Social History  . Marital Status: Married    Spouse Name: N/A  . Number of Children: N/A  . Years of Education: N/A   Occupational History  . Not on file.   Social History Main Topics  . Smoking status: Never Smoker   . Smokeless tobacco: Not on file  . Alcohol Use: Yes     Comment: occasionally  . Drug Use: No  . Sexual Activity: Not on file   Other Topics Concern  . Not on file   Social History Narrative     Review of Systems: See HPI, otherwise negative ROS   Physical Exam: BP 122/72 mmHg  Pulse 52  Temp(Src) 98.2 F (36.8 C) (Oral)  Resp 15  Ht 5\' 8"  (1.727 m)  Wt 160 lb (72.576 kg)  BMI 24.33 kg/m2  SpO2 100% General:   Alert,  pleasant and cooperative in NAD Head:  Normocephalic and atraumatic. Neck:  Supple; Lungs:  Clear throughout to auscultation.    Heart:  Regular rate and rhythm. Abdomen:  Soft, nontender and nondistended. Normal bowel sounds, without guarding, and without rebound.   Neurologic:  Alert and  oriented x4;  grossly normal neurologically.  Impression/Plan:     SCREENING  Plan:  1. TCS TODAY

## 2016-04-11 ENCOUNTER — Other Ambulatory Visit: Payer: Self-pay | Admitting: Family Medicine

## 2016-04-11 DIAGNOSIS — E2839 Other primary ovarian failure: Secondary | ICD-10-CM

## 2016-04-11 DIAGNOSIS — Z1231 Encounter for screening mammogram for malignant neoplasm of breast: Secondary | ICD-10-CM

## 2016-04-14 ENCOUNTER — Encounter (HOSPITAL_COMMUNITY): Payer: Self-pay | Admitting: Gastroenterology

## 2016-05-22 ENCOUNTER — Ambulatory Visit
Admission: RE | Admit: 2016-05-22 | Discharge: 2016-05-22 | Disposition: A | Discharge status: Home / Self Care | Payer: BLUE CROSS/BLUE SHIELD | Source: Ambulatory Visit | Attending: Family Medicine | Admitting: Family Medicine

## 2016-05-22 DIAGNOSIS — E2839 Other primary ovarian failure: Secondary | ICD-10-CM

## 2016-05-22 DIAGNOSIS — Z1231 Encounter for screening mammogram for malignant neoplasm of breast: Secondary | ICD-10-CM

## 2016-06-16 ENCOUNTER — Emergency Department (HOSPITAL_COMMUNITY): Payer: BLUE CROSS/BLUE SHIELD

## 2016-06-16 ENCOUNTER — Encounter (HOSPITAL_COMMUNITY): Payer: Self-pay

## 2016-06-16 ENCOUNTER — Emergency Department (HOSPITAL_COMMUNITY)
Admission: EM | Admit: 2016-06-16 | Discharge: 2016-06-16 | Disposition: A | Discharge status: Home / Self Care | Payer: BLUE CROSS/BLUE SHIELD | Attending: Emergency Medicine | Admitting: Emergency Medicine

## 2016-06-16 DIAGNOSIS — Y999 Unspecified external cause status: Secondary | ICD-10-CM | POA: Insufficient documentation

## 2016-06-16 DIAGNOSIS — E039 Hypothyroidism, unspecified: Secondary | ICD-10-CM | POA: Insufficient documentation

## 2016-06-16 DIAGNOSIS — S0121XA Laceration without foreign body of nose, initial encounter: Secondary | ICD-10-CM | POA: Diagnosis not present

## 2016-06-16 DIAGNOSIS — Y939 Activity, unspecified: Secondary | ICD-10-CM | POA: Diagnosis not present

## 2016-06-16 DIAGNOSIS — Y929 Unspecified place or not applicable: Secondary | ICD-10-CM | POA: Insufficient documentation

## 2016-06-16 DIAGNOSIS — W500XXA Accidental hit or strike by another person, initial encounter: Secondary | ICD-10-CM | POA: Diagnosis not present

## 2016-06-16 DIAGNOSIS — M25532 Pain in left wrist: Secondary | ICD-10-CM | POA: Insufficient documentation

## 2016-06-16 DIAGNOSIS — S0181XA Laceration without foreign body of other part of head, initial encounter: Secondary | ICD-10-CM | POA: Insufficient documentation

## 2016-06-16 MED ORDER — HYDROCODONE-ACETAMINOPHEN 5-325 MG PO TABS
1.0000 | ORAL_TABLET | Freq: Once | ORAL | Status: DC
  Filled 2016-06-16: qty 1

## 2016-06-16 MED ORDER — CEPHALEXIN 500 MG PO CAPS: 500.0000 mg | ORAL_CAPSULE | Freq: Two times a day (BID) | ORAL | 0 refills | Status: AC

## 2016-06-16 MED ORDER — LIDOCAINE HCL (PF) 1 % IJ SOLN
30.0000 mL | Freq: Once | INTRAMUSCULAR | Status: AC
  Administered 2016-06-16: 30 mL via INTRADERMAL
  Filled 2016-06-16: qty 30

## 2016-06-16 MED ORDER — CEPHALEXIN 250 MG PO CAPS
500.0000 mg | ORAL_CAPSULE | Freq: Once | ORAL | Status: AC
  Administered 2016-06-16: 500 mg via ORAL
  Filled 2016-06-16: qty 2

## 2016-06-16 MED ORDER — HYDROCODONE-ACETAMINOPHEN 5-325 MG PO TABS: 1.0000 | ORAL_TABLET | Freq: Four times a day (QID) | ORAL | 0 refills | Status: AC | PRN

## 2016-06-16 NOTE — ED Provider Notes (Signed)
[facility]-EMERGENCY DEPT Provider Note   CSN: [id number] Arrival date & time: 06/16/16  1708     History   Chief Complaint Chief Complaint  Patient presents with  . Facial Injury  . Wrist Pain    HPI Helen Parker is a 55 y.o. female.   Fall  This is a new problem. Episode onset: PTA. The problem occurs constantly. The problem has been gradually improving. Pertinent negatives include no chest pain, no abdominal pain, no headaches and no shortness of breath. Exacerbated by: exploration of laceration. The symptoms are relieved by ice. She has tried a cold compress for the symptoms. The treatment provided mild relief.    Past Medical History:  Diagnosis Date  . Hypothyroidism     Patient Active Problem List   Diagnosis Date Noted  . Special screening for malignant neoplasms, colon     Past Surgical History:  Procedure Laterality Date  . CERVICAL POLYPECTOMY    . COLONOSCOPY N/A 04/10/2016   Procedure: COLONOSCOPY;  Surgeon: West BaliSandi L Fields, MD;  Location: [facility];  Service: Endoscopy;  Laterality: N/A;  12:45 PM  . ORIF FINGER FRACTURE  10/01/2011   Procedure: OPEN REDUCTION INTERNAL FIXATION (ORIF) METACARPAL (FINGER) FRACTURE;  Surgeon: Wyn Forsterobert V Sypher Jr., MD;  Location: [facility];  Service: Orthopedics;  Laterality: Left;  left small proximal phalanx  . THYROID LOBECTOMY  2008   lt   . WISDOM TOOTH EXTRACTION      OB History    No data available       Home Medications    Prior to Admission medications   Medication Sig Start Date End Date Taking? Authorizing Provider  acetaminophen (TYLENOL) 325 MG tablet Take 650 mg by mouth every 6 (six) hours as needed.      Historical Provider, MD  levothyroxine (SYNTHROID, LEVOTHROID) 50 MCG tablet Take 50 mcg by mouth daily.      Historical Provider, MD  Probiotic Product (CULTURELLE PRO-WELL PO) Take 1 tablet by mouth daily.    Historical Provider, MD    Family History No family history on  file.  Social History Social History  Substance Use Topics  . Smoking status: Never Smoker  . Smokeless tobacco: Never Used  . Alcohol use Yes     Comment: occasionally     Allergies   Review of patient's allergies indicates no known allergies.   Review of Systems Review of Systems  Constitutional: Negative.   HENT: Positive for nosebleeds (but has since resolved).   Eyes: Negative.   Respiratory: Negative for cough and shortness of breath.   Cardiovascular: Negative for chest pain and palpitations.  Gastrointestinal: Negative for abdominal pain, diarrhea, nausea and vomiting.  Genitourinary: Negative.   Musculoskeletal:       No chronic musc issues  Skin: Negative.   Neurological: Negative for syncope, light-headedness and headaches.  Psychiatric/Behavioral: Negative.      Physical Exam Updated Vital Signs BP 147/88   Pulse 80   Temp 98.8 F (37.1 C) (Oral)   Resp 18   SpO2 99%   Physical Exam  Constitutional: She is oriented to person, place, and time. She appears well-developed and well-nourished. She appears distressed (mild).  HENT:  Head: Normocephalic. Head is with laceration. Head is without raccoon's eyes, without Battle's sign, without abrasion and without contusion.    Right Ear: External ear normal.  Left Ear: External ear normal.  Nose: Nose normal.  Mouth/Throat: Oropharynx is clear and moist.  Wounds are  hemostatic  Eyes: Conjunctivae and EOM are normal. Pupils are equal, round, and reactive to light. No scleral icterus.  Neck: Normal range of motion. Neck supple. No tracheal deviation present.  Cardiovascular: Normal rate, regular rhythm, normal heart sounds and intact distal pulses.   No murmur heard. Pulmonary/Chest: Effort normal and breath sounds normal. No stridor. No respiratory distress. She has no wheezes. She has no rales.  Abdominal: Soft. She exhibits no distension. There is no tenderness. There is no rebound and no guarding.   Musculoskeletal: She exhibits no edema, tenderness or deformity.  Neurological: She is alert and oriented to person, place, and time. She is not disoriented. GCS eye subscore is 4. GCS verbal subscore is 5. GCS motor subscore is 6.  Skin: Skin is warm. Capillary refill takes less than 2 seconds. She is not diaphoretic.  Lacerations as above  Psychiatric: She has a normal mood and affect. Her behavior is normal. Judgment and thought content normal.  Nursing note and vitals reviewed.    ED Treatments / Results  Labs (all labs ordered are listed, but only abnormal results are displayed) Labs Reviewed - No data to display  EKG  EKG Interpretation None       Radiology CXR WNL L wrist WNL  Procedures Procedures (including critical care time)  Medications Ordered in ED Medications  lidocaine (PF) (XYLOCAINE) 1 % injection 30 mL (30 mLs Intradermal Given by Other 06/16/16 2003)  cephALEXin (KEFLEX) capsule 500 mg (500 mg Oral Given 06/16/16 2148)     Initial Impression / Assessment and Plan / ED Course  I have reviewed the triage vital signs and the nursing notes.  Pertinent labs & imaging results that were available during my care of the patient were reviewed by me and considered in my medical decision making (see chart for details).   Clinical Course   Pt presents after a mechanical fall. No significant PMHx. Has laceration along nose and Upper lip. Hemostatic upon evaluation. Patient has numbness along tip of nose. No other tenderness noted along face thus do not need imaging of head. Patient also does not have any C spine or back pain. Wounds appeared cleaned but started on Keflex for Ppx. Discussed case with ENT who agreed to close lacerations. Pt had L wrist pain, which did not have a fracture on xray. CXR WNL as well. Pt tolerated procedure well. Will Rx short course of Norco for patient--after checking the [place] drug Db. First dose of Keflex given in the Ed.  Discussed follow  up plan with patient who stated agreement and understanding. Usual and customary return precautions given after a fall and wound repair.  All questions answered. PT and VS stable at d/c.   Final Clinical Impressions(s) / ED Diagnoses   Final diagnoses:  Nasal laceration, initial encounter  Facial laceration, initial encounter    New Prescriptions Discharge Medication List as of 06/16/2016  9:24 PM    START taking these medications   Details  cephALEXin (KEFLEX) 500 MG capsule Take 1 capsule (500 mg total) by mouth 2 (two) times daily., Starting Tue 06/16/2016, Until Tue 06/23/2016, Print    HYDROcodone-acetaminophen (NORCO/VICODIN) 5-325 MG tablet Take 1 tablet by mouth every 6 (six) hours as needed for severe pain., Starting Tue 06/16/2016, Print         Maretta BeesLouis Stephon Weathers, MD 06/19/16 1159    Charlynne Panderavid Hsienta Yao, MD 06/20/16 2042

## 2016-06-16 NOTE — ED Triage Notes (Signed)
Pt presents following fall today on grass. Pt. Reports she was outside near football field and a football player ran into her when she fell forward and slid across grass. Pt. Has injury to tip of nose with partial amputation. Pt. Complaint of left wrist pain, no deformity positive PMS. Pt. Also complaint of L rib cage pain from fall.

## 2016-06-16 NOTE — Consult Note (Signed)
Reason for Consult: Facial lacerations Referring Physician: Maretta BeesLouis Fornage, MD  HPI:  Helen Parker is an 55 y.o. female who [facility] today for evaluation and treatment of her facial lacerations. According to the patient, she was accidentally hit by a football player near a football field. She fell forward and slid across the grass. It resulted in near amputation of her nasal tip. She also has a 1.5 cm left upper lip laceration. No LOC. ENT consulted for facial laceration repair.  Past Medical History:  Diagnosis Date  . Hypothyroidism     Past Surgical History:  Procedure Laterality Date  . CERVICAL POLYPECTOMY    . COLONOSCOPY N/A 04/10/2016   Procedure: COLONOSCOPY;  Surgeon: West BaliSandi L Fields, MD;  Location: [facility];  Service: Endoscopy;  Laterality: N/A;  12:45 PM  . ORIF FINGER FRACTURE  10/01/2011   Procedure: OPEN REDUCTION INTERNAL FIXATION (ORIF) METACARPAL (FINGER) FRACTURE;  Surgeon: Wyn Forsterobert V Sypher Jr., MD;  Location: [facility];  Service: Orthopedics;  Laterality: Left;  left small proximal phalanx  . THYROID LOBECTOMY  2008   lt   . WISDOM TOOTH EXTRACTION      No family history on file.  Social History:  reports that she has never smoked. She has never used smokeless tobacco. She reports that she drinks alcohol. She reports that she does not use drugs.  Allergies: No Known Allergies  Prior to Admission medications   Medication Sig Start Date End Date Taking? Authorizing Provider  acetaminophen (TYLENOL) 325 MG tablet Take 650 mg by mouth every 6 (six) hours as needed.      Historical Provider, MD  levothyroxine (SYNTHROID, LEVOTHROID) 50 MCG tablet Take 50 mcg by mouth daily.      Historical Provider, MD  Probiotic Product (CULTURELLE PRO-WELL PO) Take 1 tablet by mouth daily.    Historical Provider, MD    No results found for this or any previous visit (from the past 48 hour(s)).  Dg Chest 2 View  Result Date: 06/16/2016 CLINICAL  DATA:  Fall, football injury, left chest pain EXAM: CHEST  2 VIEW COMPARISON:  None. FINDINGS: Lungs are clear.  No pleural effusion or pneumothorax. The heart is normal in size. Visualized osseous structures are within normal limits. IMPRESSION: Normal chest radiographs. Electronically Signed   By: Charline BillsSriyesh  Krishnan M.D.   On: 06/16/2016 19:58   Dg Wrist Complete Left  Result Date: 06/16/2016 CLINICAL DATA:  Fall, football injury, wrist pain EXAM: LEFT WRIST - COMPLETE 3+ VIEW COMPARISON:  None. FINDINGS: No fracture or dislocation is seen. The joint spaces are preserved. Visualized soft tissues are within normal limits. IMPRESSION: No fracture or dislocation is seen. Electronically Signed   By: Charline BillsSriyesh  Krishnan M.D.   On: 06/16/2016 19:58   Blood pressure 134/78, pulse 73, temperature 98.8 F (37.1 C), temperature source Oral, resp. rate 18, SpO2 100 %. General appearance: alert, cooperative and no distress Head: Normocephalic, without obvious abnormality Eyes: conjunctivae/corneas clear. PERRL, EOM's intact.  Ears: normal TM's and external ear canals both ears Nose: A 2cm large laceration of the nasal tip, causing near amputation of the nasal tip. The nasal cartilage is exposed. Throat: A 1.5cm upper lip laceration is noted.  Neck: no adenopathy, no carotid bruit, supple, symmetrical, trachea midline and thyroid not enlarged, symmetric, no tenderness/mass/nodules Resp: Regular nonlabor breathing Skin: Skin color, texture, turgor normal. No rashes or lesions Lymph nodes: No obvious lymphadenopathy Neurologic: Grossly normal  Procedure: Complex repair of  nasal tip laceration (2cm) and intermediate repair of upper lip laceration (1.5cm) Anesthesia: Local anesthesia with 1% lidocaine with 1:100,000 epinephrine Description: The patient is placed supine on the exam table. The laceration sites are prepped and draped in a sterile fashion.  After adequate local anesthesia is achieved, the laceration  sites are carefully debrided. Foreign bodies are carefully removed. No viable tissue is debrided. The nasal tip and the lip lacerations are closed in layers with interrupted sutures.   The patient tolerated the procedure well.    Assessment/Plan: Nasal tip and upper lip lacerations. The lacerations are repaired under local anesthesia in the emergency room. The patient may be discharged home with oral antibiotic and pain medication. She will follow-up in my office in approximately 1 week for suture removal.  Ilisha Blust,SUI W 06/16/2016, 8:59 PM

## 2016-06-22 ENCOUNTER — Ambulatory Visit (INDEPENDENT_AMBULATORY_CARE_PROVIDER_SITE_OTHER): Payer: BLUE CROSS/BLUE SHIELD | Admitting: Otolaryngology

## 2016-10-06 ENCOUNTER — Other Ambulatory Visit: Payer: Self-pay | Admitting: Obstetrics and Gynecology

## 2016-10-06 ENCOUNTER — Other Ambulatory Visit (HOSPITAL_COMMUNITY)
Admission: RE | Admit: 2016-10-06 | Discharge: 2016-10-06 | Disposition: A | Discharge status: Home / Self Care | Payer: BLUE CROSS/BLUE SHIELD | Source: Ambulatory Visit | Attending: Obstetrics and Gynecology | Admitting: Obstetrics and Gynecology

## 2016-10-06 DIAGNOSIS — Z01419 Encounter for gynecological examination (general) (routine) without abnormal findings: Secondary | ICD-10-CM | POA: Insufficient documentation

## 2016-10-06 DIAGNOSIS — Z1151 Encounter for screening for human papillomavirus (HPV): Secondary | ICD-10-CM | POA: Diagnosis not present

## 2016-10-08 LAB — CYTOLOGY - PAP
Diagnosis: NEGATIVE
HPV (WINDOPATH): NOT DETECTED

## 2016-11-13 ENCOUNTER — Other Ambulatory Visit: Payer: Self-pay | Admitting: Family

## 2016-11-13 DIAGNOSIS — E049 Nontoxic goiter, unspecified: Secondary | ICD-10-CM

## 2016-12-14 ENCOUNTER — Ambulatory Visit
Admission: RE | Admit: 2016-12-14 | Discharge: 2016-12-14 | Disposition: A | Discharge status: Home / Self Care | Payer: BLUE CROSS/BLUE SHIELD | Source: Ambulatory Visit | Attending: Family | Admitting: Family

## 2016-12-14 DIAGNOSIS — E049 Nontoxic goiter, unspecified: Secondary | ICD-10-CM

## 2017-05-14 ENCOUNTER — Other Ambulatory Visit: Payer: Self-pay | Admitting: Physician Assistant

## 2017-05-14 DIAGNOSIS — Z1231 Encounter for screening mammogram for malignant neoplasm of breast: Secondary | ICD-10-CM

## 2017-06-03 ENCOUNTER — Other Ambulatory Visit (HOSPITAL_BASED_OUTPATIENT_CLINIC_OR_DEPARTMENT_OTHER): Payer: Self-pay

## 2017-06-03 DIAGNOSIS — G4730 Sleep apnea, unspecified: Secondary | ICD-10-CM

## 2017-06-14 ENCOUNTER — Ambulatory Visit
Admission: RE | Admit: 2017-06-14 | Discharge: 2017-06-14 | Disposition: A | Discharge status: Home / Self Care | Payer: BLUE CROSS/BLUE SHIELD | Source: Ambulatory Visit | Attending: Physician Assistant | Admitting: Physician Assistant

## 2017-06-14 DIAGNOSIS — Z1231 Encounter for screening mammogram for malignant neoplasm of breast: Secondary | ICD-10-CM

## 2017-06-15 ENCOUNTER — Ambulatory Visit (HOSPITAL_BASED_OUTPATIENT_CLINIC_OR_DEPARTMENT_OTHER): Payer: BLUE CROSS/BLUE SHIELD

## 2018-10-07 ENCOUNTER — Other Ambulatory Visit: Payer: Self-pay | Admitting: Physician Assistant

## 2018-10-07 DIAGNOSIS — Z1231 Encounter for screening mammogram for malignant neoplasm of breast: Secondary | ICD-10-CM

## 2018-11-14 ENCOUNTER — Ambulatory Visit
Admission: RE | Admit: 2018-11-14 | Discharge: 2018-11-14 | Disposition: A | Discharge status: Home / Self Care | Payer: BLUE CROSS/BLUE SHIELD | Source: Ambulatory Visit | Attending: Physician Assistant | Admitting: Physician Assistant

## 2018-11-14 DIAGNOSIS — Z1231 Encounter for screening mammogram for malignant neoplasm of breast: Secondary | ICD-10-CM

## 2019-05-22 ENCOUNTER — Other Ambulatory Visit: Payer: Self-pay

## 2019-05-22 ENCOUNTER — Ambulatory Visit (INDEPENDENT_AMBULATORY_CARE_PROVIDER_SITE_OTHER): Payer: No Typology Code available for payment source | Admitting: Otolaryngology

## 2019-05-22 DIAGNOSIS — H9312 Tinnitus, left ear: Secondary | ICD-10-CM | POA: Diagnosis not present

## 2019-05-22 DIAGNOSIS — H9042 Sensorineural hearing loss, unilateral, left ear, with unrestricted hearing on the contralateral side: Secondary | ICD-10-CM | POA: Diagnosis not present

## 2019-05-22 DIAGNOSIS — H608X3 Other otitis externa, bilateral: Secondary | ICD-10-CM

## 2019-05-22 DIAGNOSIS — H6122 Impacted cerumen, left ear: Secondary | ICD-10-CM

## 2020-02-12 ENCOUNTER — Other Ambulatory Visit: Payer: Self-pay | Admitting: Physician Assistant

## 2020-02-12 DIAGNOSIS — Z1231 Encounter for screening mammogram for malignant neoplasm of breast: Secondary | ICD-10-CM

## 2020-02-23 ENCOUNTER — Other Ambulatory Visit: Payer: Self-pay

## 2020-02-23 ENCOUNTER — Ambulatory Visit
Admission: RE | Admit: 2020-02-23 | Discharge: 2020-02-23 | Disposition: A | Discharge status: Home / Self Care | Payer: No Typology Code available for payment source | Source: Ambulatory Visit | Attending: Physician Assistant | Admitting: Physician Assistant

## 2020-02-23 DIAGNOSIS — Z1231 Encounter for screening mammogram for malignant neoplasm of breast: Secondary | ICD-10-CM

## 2020-06-11 ENCOUNTER — Other Ambulatory Visit: Payer: Self-pay

## 2020-06-11 LAB — SARS CORONAVIRUS 2 (TAT 6-24 HRS): SARS Coronavirus 2: POSITIVE — AB

## 2020-06-12 ENCOUNTER — Telehealth: Payer: Self-pay | Admitting: Nurse Practitioner

## 2020-06-12 NOTE — Telephone Encounter (Signed)
Called to Discuss with patient about Covid symptoms and the use of bamlanivimab, a monoclonal antibody infusion for those with mild to moderate Covid symptoms and at a high risk of hospitalization.     Touched base with patient - she is stable with no significant health history - will defer MAB at this time

## 2021-02-24 ENCOUNTER — Other Ambulatory Visit: Payer: Self-pay | Admitting: Physician Assistant

## 2021-02-24 DIAGNOSIS — Z1231 Encounter for screening mammogram for malignant neoplasm of breast: Secondary | ICD-10-CM

## 2021-03-06 IMAGING — MG DIGITAL SCREENING BILAT W/ TOMO W/ CAD
6 of 10 series · 6 of 30 positions shown · non-contrast
Comparison: Previous exam(s).

CLINICAL DATA: Screening.

EXAM:
DIGITAL SCREENING BILATERAL MAMMOGRAM WITH TOMO AND CAD

[R CC synth-2D]
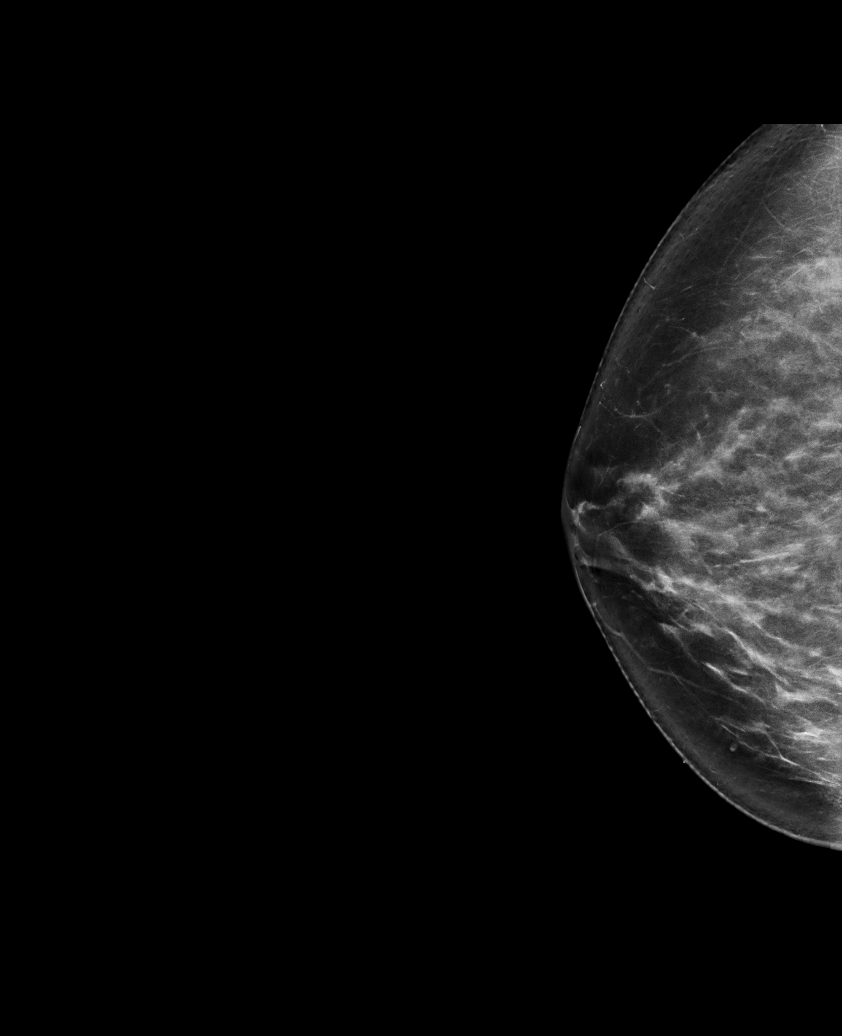

[L MLO synth-2D]
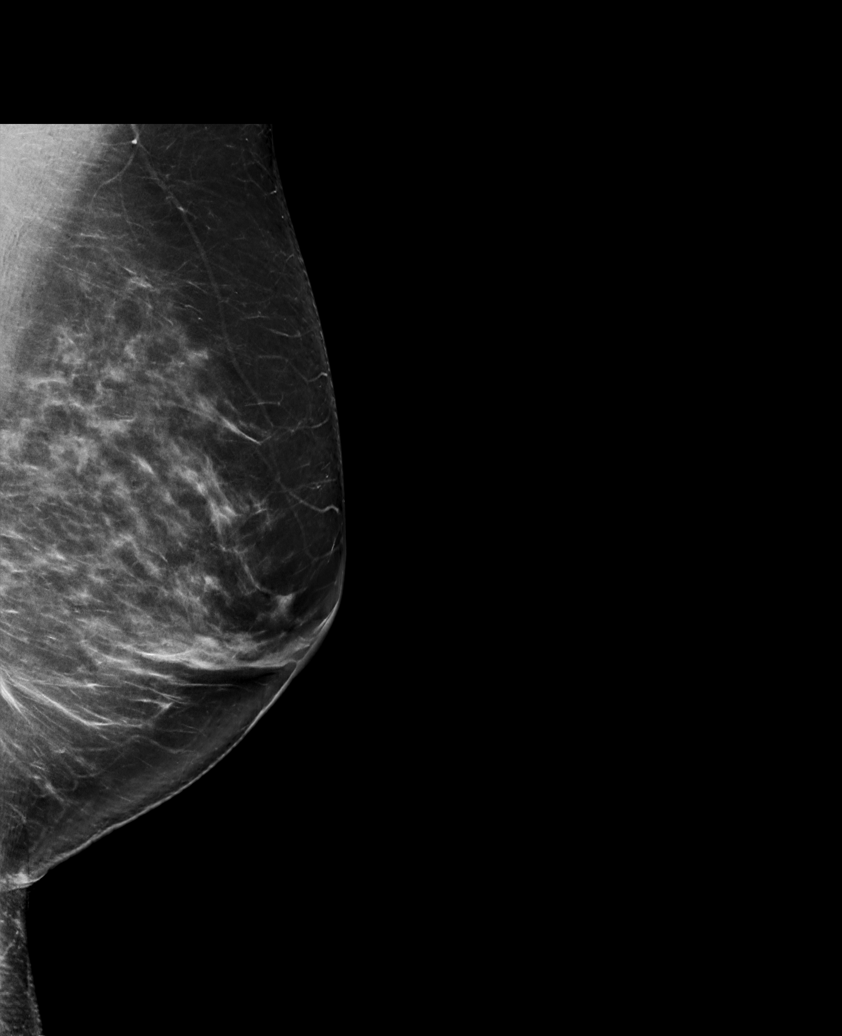

[R MLO synth-2D (1 of 2)]
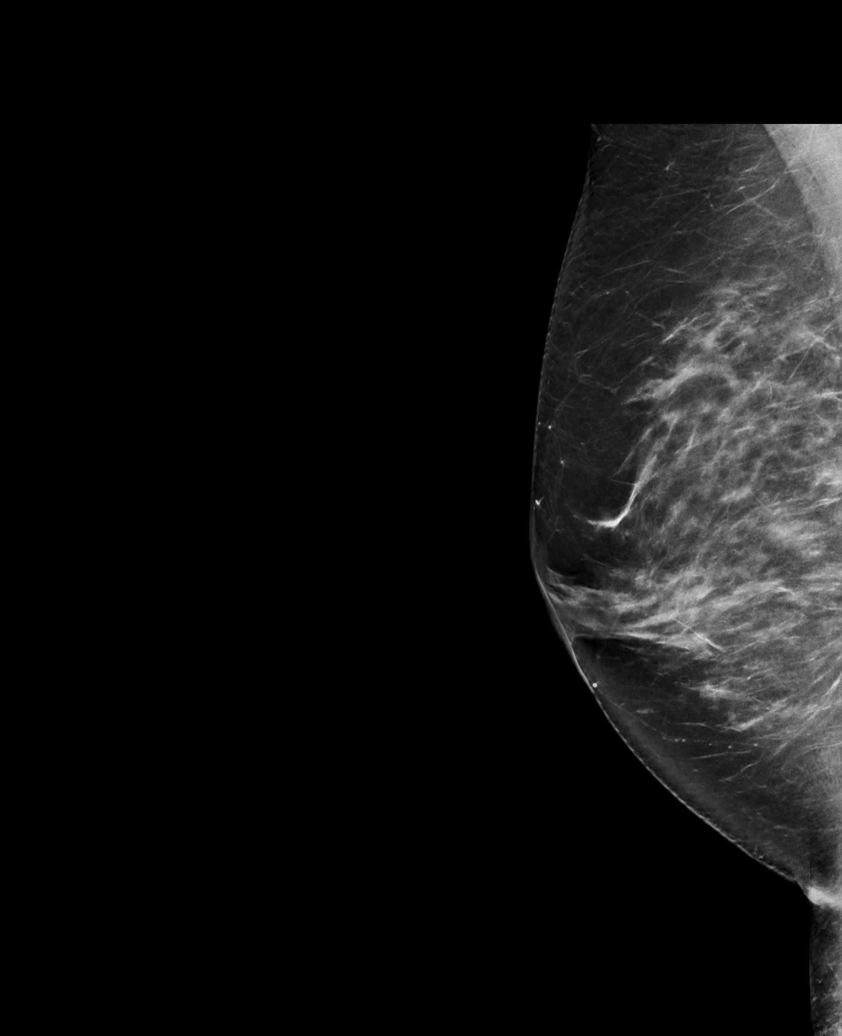

[R MLO synth-2D (2 of 2)]
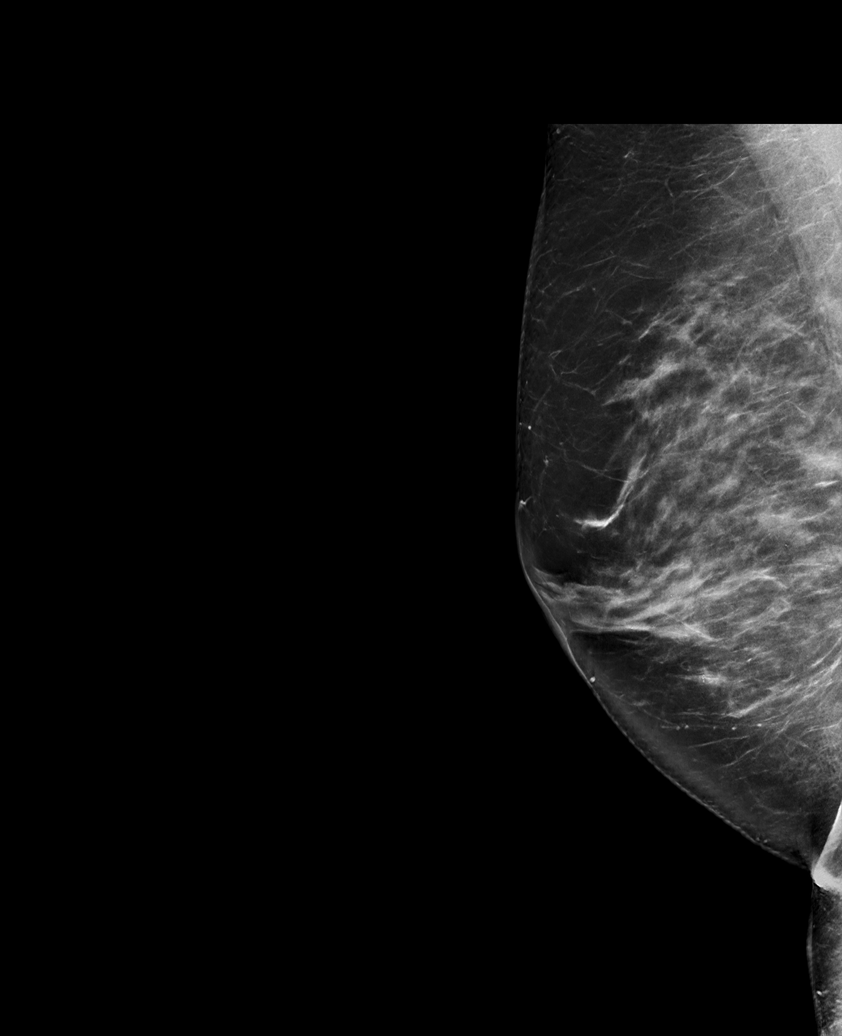

[L CC synth-2D]
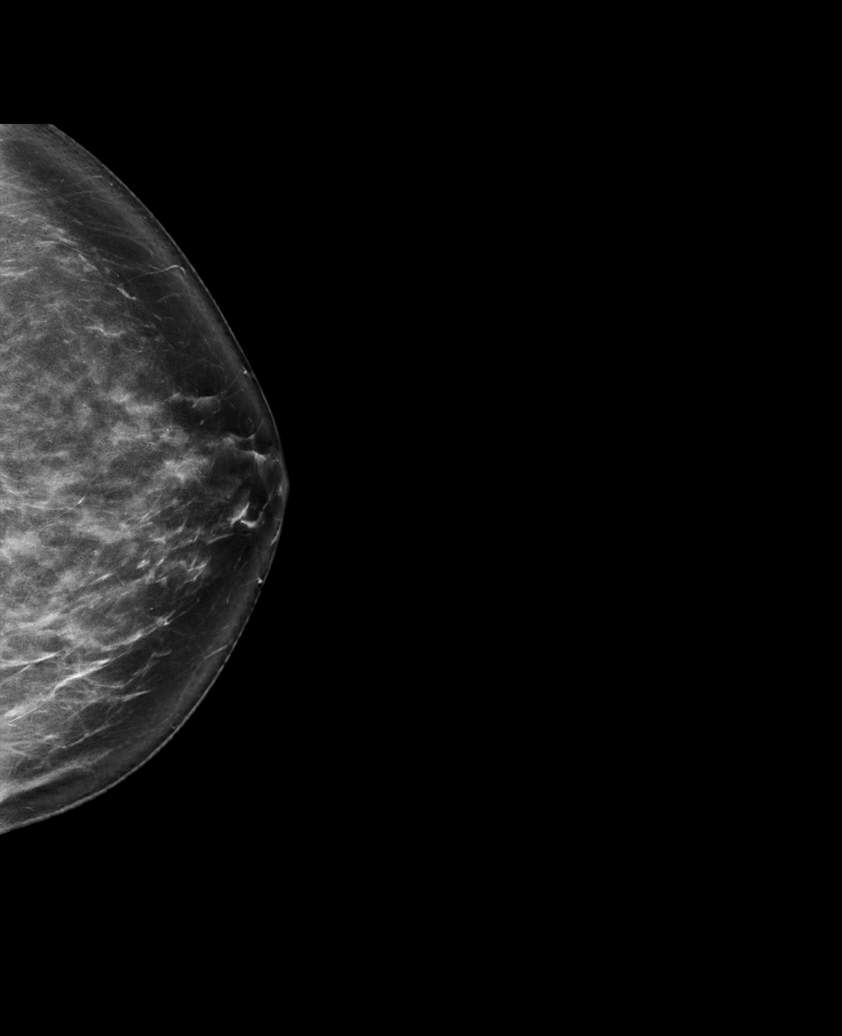

[L CC tomo · tomo slice 45/89.0]
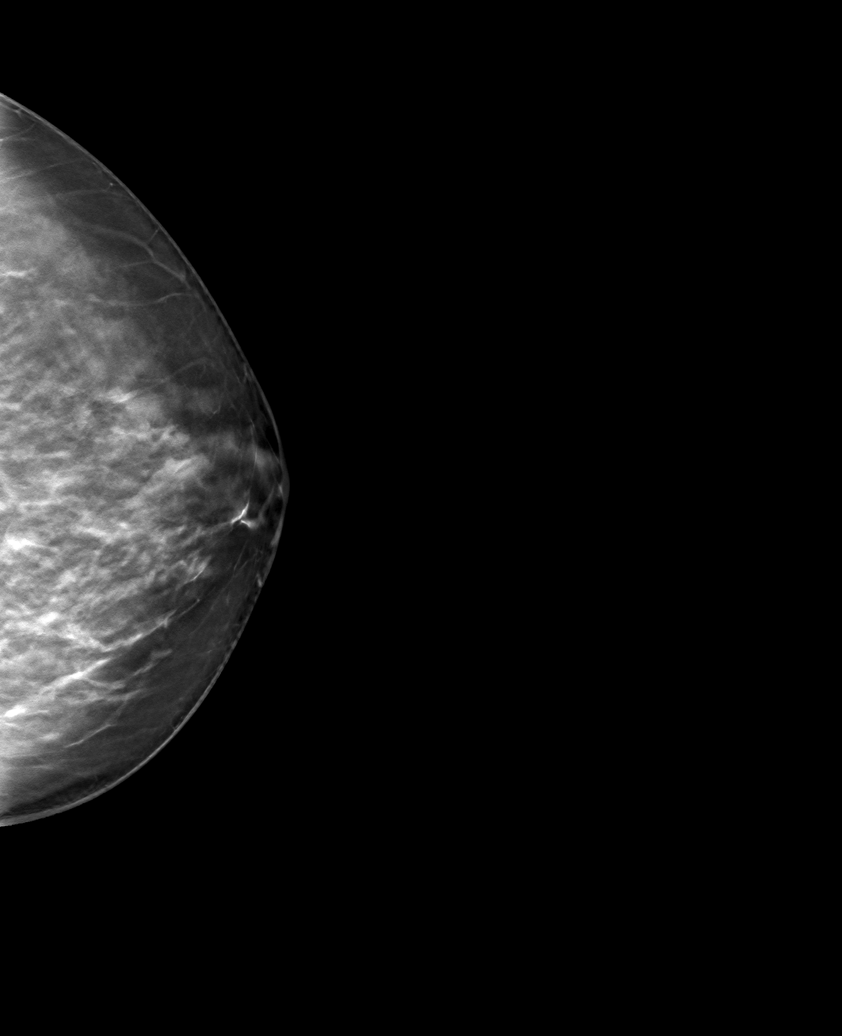

[6 of 30 positions shown; findings below may reference images not displayed]

ACR Breast Density Category c: The breast tissue is heterogeneously
dense, which may obscure small masses.
FINDINGS: There are no findings suspicious for malignancy. Images were
processed with CAD.
IMPRESSION: No mammographic evidence of malignancy. A result letter of this
screening mammogram will be mailed directly to the patient.

RECOMMENDATION:
Screening mammogram in one year. (Code:FT-U-LHB)

BI-RADS CATEGORY  1: Negative.

## 2021-04-18 ENCOUNTER — Ambulatory Visit
Admission: RE | Admit: 2021-04-18 | Discharge: 2021-04-18 | Disposition: A | Discharge status: Home / Self Care | Payer: No Typology Code available for payment source | Source: Ambulatory Visit | Attending: Physician Assistant | Admitting: Physician Assistant

## 2021-04-18 ENCOUNTER — Other Ambulatory Visit: Payer: Self-pay

## 2021-04-18 DIAGNOSIS — Z1231 Encounter for screening mammogram for malignant neoplasm of breast: Secondary | ICD-10-CM

## 2022-04-03 ENCOUNTER — Other Ambulatory Visit: Payer: Self-pay | Admitting: Physician Assistant

## 2022-04-03 DIAGNOSIS — Z1231 Encounter for screening mammogram for malignant neoplasm of breast: Secondary | ICD-10-CM

## 2022-05-13 ENCOUNTER — Ambulatory Visit: Payer: No Typology Code available for payment source

## 2022-05-29 ENCOUNTER — Ambulatory Visit
Admission: RE | Admit: 2022-05-29 | Discharge: 2022-05-29 | Disposition: A | Discharge status: Home / Self Care | Payer: No Typology Code available for payment source | Source: Ambulatory Visit | Attending: Physician Assistant | Admitting: Physician Assistant

## 2022-05-29 DIAGNOSIS — Z1231 Encounter for screening mammogram for malignant neoplasm of breast: Secondary | ICD-10-CM
